# Patient Record
Sex: Female | Born: 1955 | Race: White | Hispanic: No | Smoking: Never smoker
Health system: Southern US, Community
[De-identification: ages and names within clinical notes are randomized; demographics above are authoritative.]

## PROBLEM LIST (undated history)

## (undated) DIAGNOSIS — C801 Malignant (primary) neoplasm, unspecified: Secondary | ICD-10-CM

## (undated) DIAGNOSIS — I81 Portal vein thrombosis: Secondary | ICD-10-CM

## (undated) HISTORY — DX: Portal vein thrombosis: I81

## (undated) HISTORY — PX: COLON SURGERY: SHX602

## (undated) HISTORY — PX: TONSILLECTOMY: SUR1361

---

## 2016-04-27 ENCOUNTER — Telehealth: Payer: Self-pay | Admitting: Oncology

## 2016-04-27 ENCOUNTER — Encounter: Payer: Self-pay | Admitting: Oncology

## 2016-04-27 NOTE — Telephone Encounter (Signed)
Pt returned call and confirmed appt, completed intake, mailed pt letter, and faxed referring provider appt date/time

## 2016-05-02 ENCOUNTER — Encounter: Payer: Self-pay | Admitting: Oncology

## 2016-05-02 ENCOUNTER — Telehealth: Payer: Self-pay | Admitting: Oncology

## 2016-05-02 NOTE — Telephone Encounter (Signed)
Pt returned all and confirmed appt. Pt letter mailed

## 2016-05-07 ENCOUNTER — Ambulatory Visit: Payer: Self-pay | Admitting: Oncology

## 2016-05-15 ENCOUNTER — Telehealth: Payer: Self-pay | Admitting: Oncology

## 2016-05-15 ENCOUNTER — Encounter: Payer: Self-pay | Admitting: *Deleted

## 2016-05-15 ENCOUNTER — Ambulatory Visit (HOSPITAL_BASED_OUTPATIENT_CLINIC_OR_DEPARTMENT_OTHER): Payer: BLUE CROSS/BLUE SHIELD | Admitting: Oncology

## 2016-05-15 VITALS — BP 122/62 | HR 102 | Temp 98.7°F | Resp 17 | Wt 185.6 lb

## 2016-05-15 DIAGNOSIS — C187 Malignant neoplasm of sigmoid colon: Secondary | ICD-10-CM

## 2016-05-15 DIAGNOSIS — C779 Secondary and unspecified malignant neoplasm of lymph node, unspecified: Secondary | ICD-10-CM | POA: Diagnosis not present

## 2016-05-15 DIAGNOSIS — C189 Malignant neoplasm of colon, unspecified: Secondary | ICD-10-CM | POA: Insufficient documentation

## 2016-05-15 DIAGNOSIS — Z800 Family history of malignant neoplasm of digestive organs: Secondary | ICD-10-CM | POA: Diagnosis not present

## 2016-05-15 NOTE — Telephone Encounter (Signed)
Spoke wit Tiffany @ Interventional Radiology, per Port-a-cath placement appt on 05/21/16. NPO after 7 a.m.Marland Kitchen Chemo education class scheduled on  05/22/16. Avs report and appt schd given per 05/15/16 los.

## 2016-05-15 NOTE — Progress Notes (Signed)
[facility] New Patient Consult   Referring MD: Garlan Fillers, Md [address] [place], [place] [address]   Martha Robinson 60 y.o.  28-May-1956    Reason for Referral: Colon cancer   HPI: Martha Robinson recently relocated to [facility] and establish with a primary physician. She was referred to Dr. Shana Chute for a colonoscopy on 04/20/2016. She has a history of postprandial vomiting for many years and reports being evaluated for gallbladder disease while in [place]. Dr. Shana Chute performed an upper endoscopy and colonoscopy on 04/20/2016. Esophagitis, and a hiatal hernia were identified on the upper endoscopy. Biopsies were obtained from the esophagus and stomach. Revealed multiple diverticula in the left colon. An ulcerated 5 cm mass was noted in the sigmoid colon at 20 cm from the anus. Multiple biopsies were obtained and the area was tattooed. The pathology revealed H. pylori in the stomach. The biopsy from the esophagus revealed no evidence of dysplasia or malignancy. The sigmoid mass returned as invasive moderately differentiated adenocarcinoma. She was referred for staging CTs of the abdomen and pelvis on 05/02/2016. The lung bases are clear. The liver appeared normal. Mild focal wall thickening of the sigmoid colon with an additional area of possible focal wall thickening in the rectosigmoid junction. Small upper abdominal and retroperitoneal nodes measure up to 8 mm felt to be reactive. No regional lymphadenopathy at the sigmoid mesocolon. She was referred to Dr. Christian Mate and was taken to the operating room for a laparoscopic hand assisted sigmoid colectomy on 05/04/2016. The pathology ([id number]) revealed an invasive moderately differentiated adenocarcinoma. Tumor invaded into but not through the muscularis propria. One of 14 lymph nodes contained metastatic adenocarcinoma. The tumor was located in the sigmoid colon. No macroscopic tumor perforation. Lymphovascular  and perineural invasion were not identified. Mild to moderate peritumoral lymphocytic response. Resection margins were negative. The tumor returned with no loss of mismatch repair protein expression. She reports low abdominal bloating following surgery.  Past medical history: 1. G2 P2 2. H. pylori 04/23/2016   Surgical history: 1. Tonsillectomy as a child 2. Sinus surgery 3. Bladder suspension   Medications: Reviewed  Allergies:  Allergies  Allergen Reactions  . Sulfur Itching    Family history: Her daughter was diagnosed with colon cancer at age 78. A maternal uncle had "cancer "in his 76s. She had 3 sisters and one brother. No other family history of cancer.  Social History:   She lives with her daughter in [place]. She is retired from a [employer]. She does not use cigarettes or alcohol. No transfusion history. No risk factors for HIV or hepatitis.    ROS:   Positives include: Intermittent postprandial emesis-15-20 years, decreased stool caliber for several years, bloated feeling in the low abdomen following surgery  A complete ROS was otherwise negative.  Physical Exam:  Blood pressure 122/62, pulse (!) 102, temperature 98.7 F (37.1 C), temperature source Oral, resp. rate 17, weight 185 lb 9.6 oz (84.2 kg), SpO2 98 %.  HEENT: Oropharynx without visible mass, neck without mass Lungs: Clear bilaterally Cardiac: Regular rate and rhythm Abdomen: No hepatosplenomegaly, no mass, healed surgical incisions with staples in place  Vascular: No leg edema Lymph nodes: No cervical, supraclavicular, axillary, or inguinal nodes Neurologic: Alert and oriented, the motor exam appears intact in the upper and lower extremities Skin: No rash Musculoskeletal: No spine tenderness   LAB: CEA on 05/03/2016-1.4   05/03/2016-hemolymph 15, platelets 301, white count 7.8, creatinine 0.89, bilirubin 0.3, AST 22,  ALT 24, alkaline phosphatase 141 Imaging:  As per history of  present illness   Assessment/Plan:   1. 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 H. pylori diagnosed on upper endoscopy 04/23/2016  3.   Family history of colon cancer   Disposition:   Martha Robinson has been diagnosed with early stage III colon cancer. I discussed the prognosis and adjuvant treatment options with Martha Robinson and her daughter. We reviewed the details of the surgical pathology report. She has a good prognosis compared to the average stage III patient. We discussed evidence supporting a benefit from adjuvant 5-fluorouracil based chemotherapy in this setting. We also discussed the expected benefit associated with the addition of oxaliplatin.  I recommend adjuvant chemotherapy. We discussed 6 months of single agent capecitabine versus 3 months of either FOLFOX or CAPOX. I reviewed the recent data supporting the equivalence of 3 months and 6 months of adjuvant therapy in resected early stage III colon cancer. I explained the data is more firm with the CAPOX regimen.  She agrees to treatment with 5-FU and oxaliplatin. We reviewed the details of the FOLFOX and CAPOX regimens. She is most comfortable with FOLFOX chemotherapy.  I reviewed the potential toxicities associated with the FOLFOX regimen including the chance for nausea/vomiting, mucositis, alopecia, diarrhea, and hematologic toxicity. We discussed the rash, sun sensitivity, hyperpigmentation, and hand/foot syndrome associated with 5-fluorouracil. We reviewed the various types of neuropathy and allergic reaction seen with oxaliplatin. She agrees to proceed. Martha Robinson will attend a chemotherapy teaching class.  She will be referred for placement of a Port-A-Cath. The plan is to complete cycle 1 FOLFOX on  05/30/2016. We will see her for an office visit that day. We will refer her for a staging chest CT.  She does not appear to have hereditary non-polyposis colon cancer syndrome, but her family members are at increased risk of developing colon cancer and should receive appropriate screening. We discussed diet and exercise maneuvers that may decrease the risk of developing colon cancer.  Approximately 50 minutes were spent with the patient today. The majority of the time was used for counseling and coordination of care.  Betsy Coder, MD  05/15/2016, 3:23 PM

## 2016-05-15 NOTE — Progress Notes (Signed)
Oncology Nurse Navigator Documentation  Oncology Nurse Navigator Flowsheets 05/15/2016  Navigator Location [facility]  Navigator Encounter Type Initial MedOnc  Abnormal Finding Date 04/20/2016  Confirmed Diagnosis Date 04/23/2016  Surgery Date 05/03/2016  Patient Visit Type MedOnc;Initial  Treatment Phase Pre-Tx/Tx Discussion  Barriers/Navigation Needs Education  Education Newly Diagnosed Cancer Education  Support Groups/Services GI Support Group;Other-[facility] Support Services  Acuity Level 1  Time Spent with Patient 15  Met briefly with patient and daughter after new patient visit. Explained the role of the GI Nurse Navigator and provided New Patient Packet with information on: 1. Colon  cancer 2. Support groups 3. Advanced Directives 4. Fall Safety Plan Will follow up on her schedule and meet with her at 1st treatment. Daughter is a prior patient of Dr. Gearldine Shown who went through chemo for colon cancer as well.  Merceda Elks, RN, BSN GI Oncology [place]

## 2016-05-15 NOTE — Progress Notes (Signed)
START ON PATHWAY REGIMEN - Colorectal  COS67: mFOLFOX6 q14 Days x 6 Months   A cycle is every 14 days:     Oxaliplatin (Eloxatin(R)) 85 mg/m2 in 250 mL D5W IV over 2 hours day 1, q14 days Dose Mod: None     Leucovorin 400 mg/m2 in 250 mL D5W IV over 2 hours day 1, q14 days, followed immediately by Dose Mod: None     5-Fluorouracil 400 mg/m2 IV bolus over 2-4 minutes day 1, q14 days Dose Mod: None     5-Fluorouracil 2,400 mg/m2 in _____mL NS CIV as a 46 hour infusion starting on day 1, q14 days Dose Mod: None Additional Orders: **Note: order sheet contains two q14 day cycles**  **Always confirm dose/schedule in your pharmacy ordering system**    Patient Characteristics: Colon Adjuvant, Stage III A, B, and C AJCC T Stage: 2 AJCC N Stage: 1 AJCC Stage Grouping: IIIA AJCC M Stage: 0 Current evidence of distant metastases? No  Intent of Therapy: Curative Intent, Discussed with Patient

## 2016-05-16 ENCOUNTER — Telehealth: Payer: Self-pay | Admitting: *Deleted

## 2016-05-16 NOTE — Telephone Encounter (Signed)
Per LOS I have scheduled appts and notifeid the scheduler

## 2016-05-21 ENCOUNTER — Ambulatory Visit: Payer: BLUE CROSS/BLUE SHIELD

## 2016-05-21 ENCOUNTER — Other Ambulatory Visit (HOSPITAL_BASED_OUTPATIENT_CLINIC_OR_DEPARTMENT_OTHER): Payer: BLUE CROSS/BLUE SHIELD

## 2016-05-21 DIAGNOSIS — C189 Malignant neoplasm of colon, unspecified: Secondary | ICD-10-CM

## 2016-05-21 DIAGNOSIS — C187 Malignant neoplasm of sigmoid colon: Secondary | ICD-10-CM | POA: Diagnosis not present

## 2016-05-21 LAB — CBC WITH DIFFERENTIAL/PLATELET
BASO%: 0.7 % (ref 0.0–2.0)
Basophils Absolute: 0.1 10*3/uL (ref 0.0–0.1)
EOS%: 1.3 % (ref 0.0–7.0)
Eosinophils Absolute: 0.1 10*3/uL (ref 0.0–0.5)
HCT: 35.7 % (ref 34.8–46.6)
HGB: 12 g/dL (ref 11.6–15.9)
LYMPH%: 11.5 % — AB (ref 14.0–49.7)
MCH: 30 pg (ref 25.1–34.0)
MCHC: 33.5 g/dL (ref 31.5–36.0)
MCV: 89.4 fL (ref 79.5–101.0)
MONO#: 0.5 10*3/uL (ref 0.1–0.9)
MONO%: 6.9 % (ref 0.0–14.0)
NEUT#: 6 10*3/uL (ref 1.5–6.5)
NEUT%: 79.6 % — AB (ref 38.4–76.8)
PLATELETS: 242 10*3/uL (ref 145–400)
RBC: 3.99 10*6/uL (ref 3.70–5.45)
RDW: 13.3 % (ref 11.2–14.5)
WBC: 7.5 10*3/uL (ref 3.9–10.3)
lymph#: 0.9 10*3/uL (ref 0.9–3.3)

## 2016-05-21 LAB — COMPREHENSIVE METABOLIC PANEL
ALK PHOS: 162 U/L — AB (ref 40–150)
ALT: 33 U/L (ref 0–55)
ANION GAP: 11 meq/L (ref 3–11)
AST: 20 U/L (ref 5–34)
Albumin: 2.9 g/dL — ABNORMAL LOW (ref 3.5–5.0)
BILIRUBIN TOTAL: 0.57 mg/dL (ref 0.20–1.20)
BUN: 8.4 mg/dL (ref 7.0–26.0)
CHLORIDE: 105 meq/L (ref 98–109)
CO2: 27 meq/L (ref 22–29)
CREATININE: 0.9 mg/dL (ref 0.6–1.1)
Calcium: 9.4 mg/dL (ref 8.4–10.4)
EGFR: 68 mL/min/{1.73_m2} — ABNORMAL LOW (ref >90)
Glucose: 148 mg/dl — ABNORMAL HIGH (ref 70–140)
POTASSIUM: 3.6 meq/L (ref 3.5–5.1)
Sodium: 142 mEq/L (ref 136–145)
Total Protein: 7.6 g/dL (ref 6.4–8.3)

## 2016-05-21 LAB — CEA (IN HOUSE-CHCC): CEA (CHCC-IN HOUSE): 1.39 ng/mL (ref 0.00–5.00)

## 2016-05-21 MED ORDER — LIDOCAINE-PRILOCAINE 2.5-2.5 % EX CREA: 1.0000 "application " | TOPICAL_CREAM | CUTANEOUS | 3 refills | Status: DC | PRN

## 2016-05-22 ENCOUNTER — Other Ambulatory Visit: Payer: Self-pay | Admitting: Oncology

## 2016-05-22 ENCOUNTER — Other Ambulatory Visit: Payer: Self-pay | Admitting: *Deleted

## 2016-05-22 ENCOUNTER — Other Ambulatory Visit (HOSPITAL_COMMUNITY): Payer: BLUE CROSS/BLUE SHIELD

## 2016-05-22 ENCOUNTER — Ambulatory Visit (HOSPITAL_COMMUNITY): Payer: BLUE CROSS/BLUE SHIELD

## 2016-05-22 ENCOUNTER — Telehealth: Payer: Self-pay | Admitting: *Deleted

## 2016-05-22 DIAGNOSIS — C187 Malignant neoplasm of sigmoid colon: Secondary | ICD-10-CM

## 2016-05-22 NOTE — Telephone Encounter (Signed)
Left message on voicemail for pt to call office.  IR had port placement scheduled but it was canceled with a reference to surgeon placing port. Need to verify whether she wants port placed here by radiology.

## 2016-05-22 NOTE — Telephone Encounter (Signed)
Spoke with Tiffany in radiology: daughter canceled port placement and confirmed that pt is scheduled for port placement with her surgeon on 9/28. Will make Dr. Benay Spice and GI navigator aware.

## 2016-05-23 ENCOUNTER — Encounter: Payer: Self-pay | Admitting: *Deleted

## 2016-05-23 ENCOUNTER — Other Ambulatory Visit: Payer: Self-pay | Admitting: Oncology

## 2016-05-24 ENCOUNTER — Telehealth: Payer: Self-pay | Admitting: *Deleted

## 2016-05-24 NOTE — Telephone Encounter (Signed)
Called to follow up on how her port placement went today and was told by daughter she went to ER on Tuesday night still with severe abdominal pain and CT scan found a portal vein thrombus. Was put on heparin drip and converted to Xarelto today. Now has UTI. Surgeon has postponed the port at this time. Being discharged tomorrow.  Dr. Benay Spice was made aware and ordered to cancel chemo for 10/4, but to keep the office visit with him that day at 0945. Daughter verbalizes understanding.

## 2016-05-27 ENCOUNTER — Other Ambulatory Visit: Payer: Self-pay | Admitting: Oncology

## 2016-05-30 ENCOUNTER — Ambulatory Visit: Payer: BLUE CROSS/BLUE SHIELD

## 2016-05-30 ENCOUNTER — Encounter: Payer: Self-pay | Admitting: Nurse Practitioner

## 2016-05-30 ENCOUNTER — Telehealth: Payer: Self-pay | Admitting: Oncology

## 2016-05-30 ENCOUNTER — Ambulatory Visit (HOSPITAL_BASED_OUTPATIENT_CLINIC_OR_DEPARTMENT_OTHER): Payer: BLUE CROSS/BLUE SHIELD | Admitting: Nurse Practitioner

## 2016-05-30 ENCOUNTER — Other Ambulatory Visit: Payer: Self-pay | Admitting: *Deleted

## 2016-05-30 ENCOUNTER — Encounter: Payer: Self-pay | Admitting: *Deleted

## 2016-05-30 VITALS — BP 132/74 | HR 94 | Temp 97.7°F | Resp 16 | Ht 66.0 in | Wt 186.0 lb

## 2016-05-30 DIAGNOSIS — C187 Malignant neoplasm of sigmoid colon: Secondary | ICD-10-CM | POA: Diagnosis not present

## 2016-05-30 DIAGNOSIS — R109 Unspecified abdominal pain: Secondary | ICD-10-CM

## 2016-05-30 DIAGNOSIS — C189 Malignant neoplasm of colon, unspecified: Secondary | ICD-10-CM

## 2016-05-30 DIAGNOSIS — I81 Portal vein thrombosis: Secondary | ICD-10-CM | POA: Diagnosis not present

## 2016-05-30 HISTORY — DX: Portal vein thrombosis: I81

## 2016-05-30 MED ORDER — ONDANSETRON HCL 8 MG PO TABS: 8.0000 mg | ORAL_TABLET | Freq: Three times a day (TID) | ORAL | 1 refills | Status: DC | PRN

## 2016-05-30 MED ORDER — PROCHLORPERAZINE MALEATE 10 MG PO TABS: 10.0000 mg | ORAL_TABLET | Freq: Four times a day (QID) | ORAL | 1 refills | Status: DC | PRN

## 2016-05-30 NOTE — Progress Notes (Addendum)
  [place] OFFICE PROGRESS NOTE   Diagnosis:  Colon cancer  INTERVAL HISTORY:   Martha Robinson returns as scheduled. She was recently found to have a portal vein thrombosis after presenting with severe abdominal pain. She is currently on Xarelto. She was also diagnosed with a urinary tract infection and started on antibiotics.  She reports slight improvement in the abdominal pain. She denies any bleeding. She is nauseated this morning. She attributes the nausea to taking an antibiotic on an empty stomach.  She reports both her mother and father had "blood clots" in their legs. Her mother is on warfarin. She has 4 siblings. None have had blood clots to her knowledge.  Objective:  Vital signs in last 24 hours:  Blood pressure 132/74, pulse 94, temperature 97.7 F (36.5 C), temperature source Oral, resp. rate 16, height '5\' 6"'$  (1.676 m), weight 186 lb (84.4 kg), SpO2 100 %.    HEENT: No thrush or ulcers. Resp: Lungs clear bilaterally. Cardio: Regular rate and rhythm. GI: Abdomen is soft. No hepatomegaly. Tender over the low abdomen. Vascular: No leg edema. Skin: She has a few small scabbed skin lesions at the right lower leg and left upper leg.   Lab Results:  Lab Results  Component Value Date   WBC 7.5 05/21/2016   HGB 12.0 05/21/2016   HCT 35.7 05/21/2016   MCV 89.4 05/21/2016   PLT 242 05/21/2016   NEUTROABS 6.0 05/21/2016    Imaging:  No results found.  Medications: I have reviewed the patient's current medications.  Assessment/Plan: 1. 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 1/14 lymph nodes positive for metastatic carcinoma ? No loss of mismatch repair protein expression  2. H. pylori diagnosed on upper endoscopy 04/23/2016 3. Family history of colon cancer 4. Portal vein thrombosis 05/23/2016; on Xarelto 5. Family history of  "blood clots"   Disposition: Martha Robinson appears stable. She was recently found to have a portal vein thrombosis after presenting with abdominal pain. She was started on anticoagulation, now on Xarelto. She reports a family history of blood clots in both parents. We will obtain a hypercoagulation panel when she returns in 2 weeks.  We decided to delay the start of chemotherapy until 06/13/2016. Rather than having a Port-A-Cath placed she will have a PICC line placed the morning of 06/13/2016.  She will be seen in follow-up prior to proceeding with treatment on 06/13/2016. She will contact the office in the interim with any problems.  Patient seen with Dr. Benay Spice. 25 minutes were spent face-to-face at today's visit with the majority of that time involved in counseling/coordination of care.    Ned Card ANP/GNP-BC   05/30/2016  10:17 AM  This was a shared visit with Ned Card. Ms. Miers was diagnosed with portal vein thrombosis last week. She is now maintained on Xarelto. She continues to have abdominal pain. We decided to delay the first cycle of chemotherapy until 06/13/2016. She will not be able to come off of anticoagulation for Port-A-Cath placement.  Julieanne [place], M.D.

## 2016-05-30 NOTE — Progress Notes (Signed)
Oncology Nurse Navigator Documentation  Oncology Nurse Navigator Flowsheets 05/30/2016  Navigator Location [facility]  Navigator Encounter Type Follow-up Appt  Abnormal Finding Date -  Confirmed Diagnosis Date -  Surgery Date -  Patient Visit Type MedOnc  Treatment Phase Pre-Tx/Tx Discussion  Barriers/Navigation Needs Coordination of International aid/development worker for Upcoming Surgery/ Treatment;Pain/ Symptom Management--discussed that portal vein thrombus can cause pain for a long period of time and she needs to take her pain medication at least 1-2 times day, especially at night because she needs to get good sleep to function. Discussed PICC line and home care for this and weekly dressing change at [facility]--showed her a photograph of PICC line  Interventions Coordination of Care;Education Method  Education Method Verbal;Demonstration;Teach-back  Support Groups/Services -  Acuity Level 2  Time Spent with Patient 30  PICC line scheduled for 06/13/16 with arrival at radiology at [facility] at 0800. She verbalized understanding and agrees.

## 2016-05-30 NOTE — Telephone Encounter (Signed)
NO 10/4 LOS

## 2016-06-07 ENCOUNTER — Encounter: Payer: Self-pay | Admitting: *Deleted

## 2016-06-07 NOTE — Progress Notes (Signed)
  Oncology Nurse Navigator Documentation  Navigator Location: [facility] (06/07/16 1000) Navigator Encounter Type: Letter/Fax/Email (06/07/16 1000)   Request sent to [facility] [phone]) --2nd request--for MSI testing on case #[id number] from 05/04/16).

## 2016-06-10 ENCOUNTER — Other Ambulatory Visit: Payer: Self-pay | Admitting: Oncology

## 2016-06-13 ENCOUNTER — Ambulatory Visit (HOSPITAL_BASED_OUTPATIENT_CLINIC_OR_DEPARTMENT_OTHER): Payer: BLUE CROSS/BLUE SHIELD

## 2016-06-13 ENCOUNTER — Telehealth: Payer: Self-pay | Admitting: Oncology

## 2016-06-13 ENCOUNTER — Ambulatory Visit: Payer: BLUE CROSS/BLUE SHIELD

## 2016-06-13 ENCOUNTER — Encounter: Payer: Self-pay | Admitting: *Deleted

## 2016-06-13 ENCOUNTER — Ambulatory Visit (HOSPITAL_BASED_OUTPATIENT_CLINIC_OR_DEPARTMENT_OTHER): Payer: BLUE CROSS/BLUE SHIELD | Admitting: Oncology

## 2016-06-13 ENCOUNTER — Encounter (HOSPITAL_COMMUNITY): Payer: Self-pay | Admitting: General Surgery

## 2016-06-13 ENCOUNTER — Ambulatory Visit (HOSPITAL_COMMUNITY)
Admission: RE | Admit: 2016-06-13 | Discharge: 2016-06-13 | Disposition: A | Discharge status: Home / Self Care | Payer: BLUE CROSS/BLUE SHIELD | Source: Ambulatory Visit | Attending: Nurse Practitioner | Admitting: Nurse Practitioner

## 2016-06-13 ENCOUNTER — Other Ambulatory Visit: Payer: Self-pay | Admitting: Nurse Practitioner

## 2016-06-13 ENCOUNTER — Other Ambulatory Visit (HOSPITAL_BASED_OUTPATIENT_CLINIC_OR_DEPARTMENT_OTHER): Payer: BLUE CROSS/BLUE SHIELD

## 2016-06-13 VITALS — BP 117/57 | HR 84 | Temp 97.3°F | Resp 18 | Ht 66.0 in | Wt 185.5 lb

## 2016-06-13 VITALS — BP 134/74 | HR 89 | Temp 98.4°F | Resp 17

## 2016-06-13 DIAGNOSIS — Z79899 Other long term (current) drug therapy: Secondary | ICD-10-CM | POA: Diagnosis not present

## 2016-06-13 DIAGNOSIS — Z95828 Presence of other vascular implants and grafts: Secondary | ICD-10-CM

## 2016-06-13 DIAGNOSIS — I81 Portal vein thrombosis: Secondary | ICD-10-CM

## 2016-06-13 DIAGNOSIS — C187 Malignant neoplasm of sigmoid colon: Secondary | ICD-10-CM

## 2016-06-13 DIAGNOSIS — I878 Other specified disorders of veins: Secondary | ICD-10-CM | POA: Insufficient documentation

## 2016-06-13 DIAGNOSIS — C182 Malignant neoplasm of ascending colon: Secondary | ICD-10-CM

## 2016-06-13 DIAGNOSIS — Z452 Encounter for adjustment and management of vascular access device: Secondary | ICD-10-CM

## 2016-06-13 DIAGNOSIS — Z5111 Encounter for antineoplastic chemotherapy: Secondary | ICD-10-CM | POA: Diagnosis not present

## 2016-06-13 HISTORY — PX: IR GENERIC HISTORICAL: IMG1180011

## 2016-06-13 LAB — CBC WITH DIFFERENTIAL/PLATELET
BASO%: 0.3 % (ref 0.0–2.0)
Basophils Absolute: 0 10*3/uL (ref 0.0–0.1)
EOS%: 2.1 % (ref 0.0–7.0)
Eosinophils Absolute: 0.1 10*3/uL (ref 0.0–0.5)
HCT: 33.9 % — ABNORMAL LOW (ref 34.8–46.6)
HEMOGLOBIN: 11.1 g/dL — AB (ref 11.6–15.9)
LYMPH#: 0.9 10*3/uL (ref 0.9–3.3)
LYMPH%: 22.7 % (ref 14.0–49.7)
MCH: 29.1 pg (ref 25.1–34.0)
MCHC: 32.7 g/dL (ref 31.5–36.0)
MCV: 88.7 fL (ref 79.5–101.0)
MONO#: 0.3 10*3/uL (ref 0.1–0.9)
MONO%: 7.1 % (ref 0.0–14.0)
NEUT%: 67.8 % (ref 38.4–76.8)
NEUTROS ABS: 2.6 10*3/uL (ref 1.5–6.5)
NRBC: 0 % (ref 0–0)
Platelets: 145 10*3/uL (ref 145–400)
RBC: 3.82 10*6/uL (ref 3.70–5.45)
RDW: 14 % (ref 11.2–14.5)
WBC: 3.8 10*3/uL — AB (ref 3.9–10.3)

## 2016-06-13 LAB — COMPREHENSIVE METABOLIC PANEL
ALBUMIN: 3.3 g/dL — AB (ref 3.5–5.0)
ALK PHOS: 128 U/L (ref 40–150)
ALT: 15 U/L (ref 0–55)
AST: 20 U/L (ref 5–34)
Anion Gap: 8 mEq/L (ref 3–11)
BILIRUBIN TOTAL: 0.67 mg/dL (ref 0.20–1.20)
BUN: 9.1 mg/dL (ref 7.0–26.0)
CO2: 23 meq/L (ref 22–29)
Calcium: 9.3 mg/dL (ref 8.4–10.4)
Chloride: 109 mEq/L (ref 98–109)
Creatinine: 0.9 mg/dL (ref 0.6–1.1)
EGFR: 73 mL/min/{1.73_m2} — AB (ref >90)
GLUCOSE: 165 mg/dL — AB (ref 70–140)
Potassium: 3.7 mEq/L (ref 3.5–5.1)
SODIUM: 140 meq/L (ref 136–145)
TOTAL PROTEIN: 7.4 g/dL (ref 6.4–8.3)

## 2016-06-13 MED ORDER — SODIUM CHLORIDE 0.9% FLUSH
10.0000 mL | INTRAVENOUS | Status: DC | PRN
  Filled 2016-06-13: qty 10

## 2016-06-13 MED ORDER — HEPARIN SOD (PORK) LOCK FLUSH 100 UNIT/ML IV SOLN
500.0000 [IU] | Freq: Once | INTRAVENOUS | Status: DC | PRN
  Filled 2016-06-13: qty 5

## 2016-06-13 MED ORDER — PALONOSETRON HCL INJECTION 0.25 MG/5ML
INTRAVENOUS | Status: AC
  Filled 2016-06-13: qty 5

## 2016-06-13 MED ORDER — LIDOCAINE HCL 1 % IJ SOLN
INTRAMUSCULAR | Status: DC | PRN
  Administered 2016-06-13: 5 mL

## 2016-06-13 MED ORDER — HEPARIN SOD (PORK) LOCK FLUSH 100 UNIT/ML IV SOLN
250.0000 [IU] | Freq: Once | INTRAVENOUS | Status: AC
  Administered 2016-06-13: 250 [IU] via INTRAVENOUS
  Filled 2016-06-13: qty 5

## 2016-06-13 MED ORDER — SODIUM CHLORIDE 0.9% FLUSH
10.0000 mL | INTRAVENOUS | Status: DC | PRN
  Administered 2016-06-13: 10 mL via INTRAVENOUS
  Filled 2016-06-13: qty 10

## 2016-06-13 MED ORDER — PALONOSETRON HCL INJECTION 0.25 MG/5ML
0.2500 mg | Freq: Once | INTRAVENOUS | Status: AC
  Administered 2016-06-13: 0.25 mg via INTRAVENOUS

## 2016-06-13 MED ORDER — OXALIPLATIN CHEMO INJECTION 100 MG/20ML
85.0000 mg/m2 | Freq: Once | INTRAVENOUS | Status: AC
  Administered 2016-06-13: 170 mg via INTRAVENOUS
  Filled 2016-06-13: qty 34

## 2016-06-13 MED ORDER — DEXTROSE 5 % IV SOLN
Freq: Once | INTRAVENOUS | Status: AC
  Administered 2016-06-13: 13:00:00 via INTRAVENOUS

## 2016-06-13 MED ORDER — SODIUM CHLORIDE 0.9 % IV SOLN
2400.0000 mg/m2 | INTRAVENOUS | Status: DC
  Administered 2016-06-13: 4750 mg via INTRAVENOUS
  Filled 2016-06-13: qty 95

## 2016-06-13 MED ORDER — LIDOCAINE HCL 1 % IJ SOLN
INTRAMUSCULAR | Status: AC
Start: 2016-06-13 — End: 2016-06-13
  Filled 2016-06-13: qty 20

## 2016-06-13 MED ORDER — DEXAMETHASONE SODIUM PHOSPHATE 10 MG/ML IJ SOLN
10.0000 mg | Freq: Once | INTRAMUSCULAR | Status: AC
  Administered 2016-06-13: 10 mg via INTRAVENOUS

## 2016-06-13 MED ORDER — FLUOROURACIL CHEMO INJECTION 2.5 GM/50ML
400.0000 mg/m2 | Freq: Once | INTRAVENOUS | Status: AC
  Administered 2016-06-13: 800 mg via INTRAVENOUS
  Filled 2016-06-13: qty 16

## 2016-06-13 MED ORDER — DEXAMETHASONE SODIUM PHOSPHATE 10 MG/ML IJ SOLN
INTRAMUSCULAR | Status: AC
  Filled 2016-06-13: qty 1

## 2016-06-13 MED ORDER — LEUCOVORIN CALCIUM INJECTION 350 MG
400.0000 mg/m2 | Freq: Once | INTRAVENOUS | Status: AC
  Administered 2016-06-13: 792 mg via INTRAVENOUS
  Filled 2016-06-13: qty 39.6

## 2016-06-13 NOTE — Progress Notes (Signed)
[place] OFFICE PROGRESS NOTE   Diagnosis: Colon cancer  INTERVAL HISTORY:   Martha Robinson returns as scheduled. The abdominal pain has resolved. She underwent placement of a PICC earlier today. She is here to begin adjuvant chemotherapy.  Objective:  Vital signs in last 24 hours:  Blood pressure (!) 117/57, pulse 84, temperature 97.3 F (36.3 C), temperature source Oral, resp. rate 18, height '5\' 6"'$  (1.676 m), weight 185 lb 8 oz (84.1 kg), SpO2 99 %.    Resp: Lungs clear bilaterally Cardio: Regular rate and rhythm GI: No hepatomegaly, nontender, no mass, soft Vascular: No leg edema  Portacath/PICC-without erythema  Lab Results:  Lab Results  Component Value Date   WBC 3.8 (L) 06/13/2016   HGB 11.1 (L) 06/13/2016   HCT 33.9 (L) 06/13/2016   MCV 88.7 06/13/2016   PLT 145 06/13/2016   NEUTROABS 2.6 06/13/2016      Imaging:  Ir Fluoro Guide Cv Line Right  Result Date: 06/13/2016 INDICATION: Patient with a history of sigmoid colon cancer who needs venous access for chemotherapy. EXAM: ULTRASOUND AND FLUOROSCOPIC GUIDED PICC LINE INSERTION MEDICATIONS: 1% lidocaine. CONTRAST:  None FLUOROSCOPY TIME:  Forty a seconds (8 mGy) COMPLICATIONS: None immediate. TECHNIQUE: The procedure, risks, benefits, and alternatives were explained to the patient and informed written consent was obtained. A timeout was performed prior to the initiation of the procedure. The right upper extremity was prepped with chlorhexidine in a sterile fashion, and a sterile drape was applied covering the operative field. Maximum barrier sterile technique with sterile gowns and gloves were used for the procedure. A timeout was performed prior to the initiation of the procedure. Local anesthesia was provided with 1% lidocaine. Under direct ultrasound guidance, the right basilic vein was accessed with a micropuncture kit after the overlying soft tissues were anesthetized with 1% lidocaine. An  ultrasound image was saved for documentation purposes. A guidewire was advanced to the level of the superior caval-atrial junction for measurement purposes and the PICC line was cut to length. A peel-away sheath was placed and a 38 cm, 5 [place], single lumen was inserted to level of the superior caval-atrial junction. A post procedure spot fluoroscopic was obtained. The catheter easily aspirated and flushed and was sutured in place. A dressing was placed. The patient tolerated the procedure well without immediate post procedural complication. FINDINGS: After catheter placement, the tip lies within the superior cavoatrial junction. The catheter aspirates and flushes normally and is ready for immediate use. IMPRESSION: Successful ultrasound and fluoroscopic guided placement of a right basilic vein approach, 38 cm, 5 French, single lumen PICC with tip at the superior caval-atrial junction. The PICC line is ready for immediate use. Read by: Saverio Danker, PA-C Electronically Signed   By: Aletta Edouard M.D.   On: 06/13/2016 09:42   Ir US Guide Vasc Access Right  Result Date: 06/13/2016 INDICATION: Patient with a history of sigmoid colon cancer who needs venous access for chemotherapy. EXAM: ULTRASOUND AND FLUOROSCOPIC GUIDED PICC LINE INSERTION MEDICATIONS: 1% lidocaine. CONTRAST:  None FLUOROSCOPY TIME:  Forty a seconds (8 mGy) COMPLICATIONS: None immediate. TECHNIQUE: The procedure, risks, benefits, and alternatives were explained to the patient and informed written consent was obtained. A timeout was performed prior to the initiation of the procedure. The right upper extremity was prepped with chlorhexidine in a sterile fashion, and a sterile drape was applied covering the operative field. Maximum barrier sterile technique with sterile gowns and gloves were used for the procedure. A  timeout was performed prior to the initiation of the procedure. Local anesthesia was provided with 1% lidocaine. Under direct  ultrasound guidance, the right basilic vein was accessed with a micropuncture kit after the overlying soft tissues were anesthetized with 1% lidocaine. An ultrasound image was saved for documentation purposes. A guidewire was advanced to the level of the superior caval-atrial junction for measurement purposes and the PICC line was cut to length. A peel-away sheath was placed and a 38 cm, 5 [place], single lumen was inserted to level of the superior caval-atrial junction. A post procedure spot fluoroscopic was obtained. The catheter easily aspirated and flushed and was sutured in place. A dressing was placed. The patient tolerated the procedure well without immediate post procedural complication. FINDINGS: After catheter placement, the tip lies within the superior cavoatrial junction. The catheter aspirates and flushes normally and is ready for immediate use. IMPRESSION: Successful ultrasound and fluoroscopic guided placement of a right basilic vein approach, 38 cm, 5 French, single lumen PICC with tip at the superior caval-atrial junction. The PICC line is ready for immediate use. Read by: Saverio Danker, PA-C Electronically Signed   By: Aletta Edouard M.D.   On: 06/13/2016 09:42    Medications: I have reviewed the patient's current medications.  Assessment/Plan: 1. 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 1/14 lymph nodes positive for metastatic carcinoma ? No loss of mismatch repair protein expression ? Cycle 1 adjuvant FOLFOX 06/13/2016  2. H. pylori diagnosed on upper endoscopy 04/23/2016 3. Family history of colon cancer 4. Portal vein thrombosis 05/23/2016; on Xarelto 5. Family history of "blood clots"-hypercoagulable panel submitted 06/13/2016 6. PICC placement 06/13/2016    Disposition:  Martha Robinson appears well. The plan is to proceed with cycle 1 FOLFOX today. She will  receive an influenza vaccine today. She will return for an office visit and chemotherapy in 2 weeks.  Betsy Coder, MD  06/13/2016  11:04 AM

## 2016-06-13 NOTE — Patient Instructions (Signed)
[facility] Discharge Instructions for Patients Receiving Chemotherapy  Today you received the following chemotherapy agents Oxaliplatin, Leucovorin and Adrucil  To help prevent nausea and vomiting after your treatment, we encourage you to take your nausea medication as prescribed by MD.   If you develop nausea and vomiting that is not controlled by your nausea medication, call the clinic.   BELOW ARE SYMPTOMS THAT SHOULD BE REPORTED IMMEDIATELY:  *FEVER GREATER THAN 100.5 F  *CHILLS WITH OR WITHOUT FEVER  NAUSEA AND VOMITING THAT IS NOT CONTROLLED WITH YOUR NAUSEA MEDICATION  *UNUSUAL SHORTNESS OF BREATH  *UNUSUAL BRUISING OR BLEEDING  TENDERNESS IN MOUTH AND THROAT WITH OR WITHOUT PRESENCE OF ULCERS  *URINARY PROBLEMS  *BOWEL PROBLEMS  UNUSUAL RASH Items with * indicate a potential emergency and should be followed up as soon as possible.  Feel free to call the clinic you have any questions or concerns. The clinic phone number is [phone].  Please show the [place] at check-in to the Emergency Department and triage nurse.   Oxaliplatin Injection What is this medicine? OXALIPLATIN (ox AL i PLA tin) is a chemotherapy drug. It targets fast dividing cells, like cancer cells, and causes these cells to die. This medicine is used to treat cancers of the colon and rectum, and many other cancers. This medicine may be used for other purposes; ask your health care provider or pharmacist if you have questions. What should I tell my health care provider before I take this medicine? They need to know if you have any of these conditions: -kidney disease -an unusual or allergic reaction to oxaliplatin, other chemotherapy, other medicines, foods, dyes, or preservatives -pregnant or trying to get pregnant -breast-feeding How should I use this medicine? This drug is given as an infusion into a vein. It is administered in a hospital or clinic by a specially  trained health care professional. Talk to your pediatrician regarding the use of this medicine in children. Special care may be needed. Overdosage: If you think you have taken too much of this medicine contact a poison control center or emergency room at once. NOTE: This medicine is only for you. Do not share this medicine with others. What if I miss a dose? It is important not to miss a dose. Call your doctor or health care professional if you are unable to keep an appointment. What may interact with this medicine? -medicines to increase blood counts like filgrastim, pegfilgrastim, sargramostim -probenecid -some antibiotics like amikacin, gentamicin, neomycin, polymyxin B, streptomycin, tobramycin -zalcitabine Talk to your doctor or health care professional before taking any of these medicines: -acetaminophen -aspirin -ibuprofen -ketoprofen -naproxen This list may not describe all possible interactions. Give your health care provider a list of all the medicines, herbs, non-prescription drugs, or dietary supplements you use. Also tell them if you smoke, drink alcohol, or use illegal drugs. Some items may interact with your medicine. What should I watch for while using this medicine? Your condition will be monitored carefully while you are receiving this medicine. You will need important blood work done while you are taking this medicine. This medicine can make you more sensitive to cold. Do not drink cold drinks or use ice. Cover exposed skin before coming in contact with cold temperatures or cold objects. When out in cold weather wear warm clothing and cover your mouth and nose to warm the air that goes into your lungs. Tell your doctor if you get sensitive to the cold. This drug  may make you feel generally unwell. This is not uncommon, as chemotherapy can affect healthy cells as well as cancer cells. Report any side effects. Continue your course of treatment even though you feel ill unless  your doctor tells you to stop. In some cases, you may be given additional medicines to help with side effects. Follow all directions for their use. Call your doctor or health care professional for advice if you get a fever, chills or sore throat, or other symptoms of a cold or flu. Do not treat yourself. This drug decreases your body's ability to fight infections. Try to avoid being around people who are sick. This medicine may increase your risk to bruise or bleed. Call your doctor or health care professional if you notice any unusual bleeding. Be careful brushing and flossing your teeth or using a toothpick because you may get an infection or bleed more easily. If you have any dental work done, tell your dentist you are receiving this medicine. Avoid taking products that contain aspirin, acetaminophen, ibuprofen, naproxen, or ketoprofen unless instructed by your doctor. These medicines may hide a fever. Do not become pregnant while taking this medicine. Women should inform their doctor if they wish to become pregnant or think they might be pregnant. There is a potential for serious side effects to an unborn child. Talk to your health care professional or pharmacist for more information. Do not breast-feed an infant while taking this medicine. Call your doctor or health care professional if you get diarrhea. Do not treat yourself. What side effects may I notice from receiving this medicine? Side effects that you should report to your doctor or health care professional as soon as possible: -allergic reactions like skin rash, itching or hives, swelling of the face, lips, or tongue -low blood counts - This drug may decrease the number of white blood cells, red blood cells and platelets. You may be at increased risk for infections and bleeding. -signs of infection - fever or chills, cough, sore throat, pain or difficulty passing urine -signs of decreased platelets or bleeding - bruising, pinpoint red spots  on the skin, black, tarry stools, nosebleeds -signs of decreased red blood cells - unusually weak or tired, fainting spells, lightheadedness -breathing problems -chest pain, pressure -cough -diarrhea -jaw tightness -mouth sores -nausea and vomiting -pain, swelling, redness or irritation at the injection site -pain, tingling, numbness in the hands or feet -problems with balance, talking, walking -redness, blistering, peeling or loosening of the skin, including inside the mouth -trouble passing urine or change in the amount of urine Side effects that usually do not require medical attention (report to your doctor or health care professional if they continue or are bothersome): -changes in vision -constipation -hair loss -loss of appetite -metallic taste in the mouth or changes in taste -stomach pain This list may not describe all possible side effects. Call your doctor for medical advice about side effects. You may report side effects to FDA at 1-800-FDA-1088. Where should I keep my medicine? This drug is given in a hospital or clinic and will not be stored at home. NOTE: This sheet is a summary. It may not cover all possible information. If you have questions about this medicine, talk to your doctor, pharmacist, or health care provider.    2016, Elsevier/Gold Standard. (2008-03-09 17:22:47)  Leucovorin injection What is this medicine? LEUCOVORIN (loo koe VOR in) is used to prevent or treat the harmful effects of some medicines. This medicine is used to treat  anemia caused by a low amount of folic acid in the body. It is also used with 5-fluorouracil (5-FU) to treat colon cancer. This medicine may be used for other purposes; ask your health care provider or pharmacist if you have questions. What should I tell my health care provider before I take this medicine? They need to know if you have any of these conditions: -anemia from low levels of vitamin B-12 in the blood -an unusual or  allergic reaction to leucovorin, folic acid, other medicines, foods, dyes, or preservatives -pregnant or trying to get pregnant -breast-feeding How should I use this medicine? This medicine is for injection into a muscle or into a vein. It is given by a health care professional in a hospital or clinic setting. Talk to your pediatrician regarding the use of this medicine in children. Special care may be needed. Overdosage: If you think you have taken too much of this medicine contact a poison control center or emergency room at once. NOTE: This medicine is only for you. Do not share this medicine with others. What if I miss a dose? This does not apply. What may interact with this medicine? -capecitabine -fluorouracil -phenobarbital -phenytoin -primidone -trimethoprim-sulfamethoxazole This list may not describe all possible interactions. Give your health care provider a list of all the medicines, herbs, non-prescription drugs, or dietary supplements you use. Also tell them if you smoke, drink alcohol, or use illegal drugs. Some items may interact with your medicine. What should I watch for while using this medicine? Your condition will be monitored carefully while you are receiving this medicine. This medicine may increase the side effects of 5-fluorouracil, 5-FU. Tell your doctor or health care professional if you have diarrhea or mouth sores that do not get better or that get worse. What side effects may I notice from receiving this medicine? Side effects that you should report to your doctor or health care professional as soon as possible: -allergic reactions like skin rash, itching or hives, swelling of the face, lips, or tongue -breathing problems -fever, infection -mouth sores -unusual bleeding or bruising -unusually weak or tired Side effects that usually do not require medical attention (report to your doctor or health care professional if they continue or are  bothersome): -constipation or diarrhea -loss of appetite -nausea, vomiting This list may not describe all possible side effects. Call your doctor for medical advice about side effects. You may report side effects to FDA at 1-800-FDA-1088. Where should I keep my medicine? This drug is given in a hospital or clinic and will not be stored at home. NOTE: This sheet is a summary. It may not cover all possible information. If you have questions about this medicine, talk to your doctor, pharmacist, or health care provider.    2016, Elsevier/Gold Standard. (2008-02-17 16:50:29)   Fluorouracil, 5-FU injection What is this medicine? FLUOROURACIL, 5-FU (flure oh YOOR a sil) is a chemotherapy drug. It slows the growth of cancer cells. This medicine is used to treat many types of cancer like breast cancer, colon or rectal cancer, pancreatic cancer, and stomach cancer. This medicine may be used for other purposes; ask your health care provider or pharmacist if you have questions. What should I tell my health care provider before I take this medicine? They need to know if you have any of these conditions: -blood disorders -dihydropyrimidine dehydrogenase (DPD) deficiency -infection (especially a virus infection such as chickenpox, cold sores, or herpes) -kidney disease -liver disease -malnourished, poor nutrition -recent or ongoing  radiation therapy -an unusual or allergic reaction to fluorouracil, other chemotherapy, other medicines, foods, dyes, or preservatives -pregnant or trying to get pregnant -breast-feeding How should I use this medicine? This drug is given as an infusion or injection into a vein. It is administered in a hospital or clinic by a specially trained health care professional. Talk to your pediatrician regarding the use of this medicine in children. Special care may be needed. Overdosage: If you think you have taken too much of this medicine contact a poison control center or  emergency room at once. NOTE: This medicine is only for you. Do not share this medicine with others. What if I miss a dose? It is important not to miss your dose. Call your doctor or health care professional if you are unable to keep an appointment. What may interact with this medicine? -allopurinol -cimetidine -dapsone -digoxin -hydroxyurea -leucovorin -levamisole -medicines for seizures like ethotoin, fosphenytoin, phenytoin -medicines to increase blood counts like filgrastim, pegfilgrastim, sargramostim -medicines that treat or prevent blood clots like warfarin, enoxaparin, and dalteparin -methotrexate -metronidazole -pyrimethamine -some other chemotherapy drugs like busulfan, cisplatin, estramustine, vinblastine -trimethoprim -trimetrexate -vaccines Talk to your doctor or health care professional before taking any of these medicines: -acetaminophen -aspirin -ibuprofen -ketoprofen -naproxen This list may not describe all possible interactions. Give your health care provider a list of all the medicines, herbs, non-prescription drugs, or dietary supplements you use. Also tell them if you smoke, drink alcohol, or use illegal drugs. Some items may interact with your medicine. What should I watch for while using this medicine? Visit your doctor for checks on your progress. This drug may make you feel generally unwell. This is not uncommon, as chemotherapy can affect healthy cells as well as cancer cells. Report any side effects. Continue your course of treatment even though you feel ill unless your doctor tells you to stop. In some cases, you may be given additional medicines to help with side effects. Follow all directions for their use. Call your doctor or health care professional for advice if you get a fever, chills or sore throat, or other symptoms of a cold or flu. Do not treat yourself. This drug decreases your body's ability to fight infections. Try to avoid being around people  who are sick. This medicine may increase your risk to bruise or bleed. Call your doctor or health care professional if you notice any unusual bleeding. Be careful brushing and flossing your teeth or using a toothpick because you may get an infection or bleed more easily. If you have any dental work done, tell your dentist you are receiving this medicine. Avoid taking products that contain aspirin, acetaminophen, ibuprofen, naproxen, or ketoprofen unless instructed by your doctor. These medicines may hide a fever. Do not become pregnant while taking this medicine. Women should inform their doctor if they wish to become pregnant or think they might be pregnant. There is a potential for serious side effects to an unborn child. Talk to your health care professional or pharmacist for more information. Do not breast-feed an infant while taking this medicine. Men should inform their doctor if they wish to father a child. This medicine may lower sperm counts. Do not treat diarrhea with over the counter products. Contact your doctor if you have diarrhea that lasts more than 2 days or if it is severe and watery. This medicine can make you more sensitive to the sun. Keep out of the sun. If you cannot avoid being in the sun, wear  protective clothing and use sunscreen. Do not use sun lamps or tanning beds/booths. What side effects may I notice from receiving this medicine? Side effects that you should report to your doctor or health care professional as soon as possible: -allergic reactions like skin rash, itching or hives, swelling of the face, lips, or tongue -low blood counts - this medicine may decrease the number of white blood cells, red blood cells and platelets. You may be at increased risk for infections and bleeding. -signs of infection - fever or chills, cough, sore throat, pain or difficulty passing urine -signs of decreased platelets or bleeding - bruising, pinpoint red spots on the skin, black, tarry  stools, blood in the urine -signs of decreased red blood cells - unusually weak or tired, fainting spells, lightheadedness -breathing problems -changes in vision -chest pain -mouth sores -nausea and vomiting -pain, swelling, redness at site where injected -pain, tingling, numbness in the hands or feet -redness, swelling, or sores on hands or feet -stomach pain -unusual bleeding Side effects that usually do not require medical attention (report to your doctor or health care professional if they continue or are bothersome): -changes in finger or toe nails -diarrhea -dry or itchy skin -hair loss -headache -loss of appetite -sensitivity of eyes to the light -stomach upset -unusually teary eyes This list may not describe all possible side effects. Call your doctor for medical advice about side effects. You may report side effects to FDA at 1-800-FDA-1088. Where should I keep my medicine? This drug is given in a hospital or clinic and will not be stored at home. NOTE: This sheet is a summary. It may not cover all possible information. If you have questions about this medicine, talk to your doctor, pharmacist, or health care provider.    2016, Elsevier/Gold Standard. (2007-12-17 13:53:16)

## 2016-06-13 NOTE — Procedures (Signed)
Successful placement of a single lumen 38cm PICC line at the [facility] Ready for immediate use  Tyric Rodeheaver E 9:40 AM 06/13/2016

## 2016-06-13 NOTE — Telephone Encounter (Signed)
GAVE RELATIVE AVS REPORT AND APPOINTMENTS FOR October AND November

## 2016-06-13 NOTE — Progress Notes (Signed)
Oncology Nurse Navigator Documentation  Oncology Nurse Navigator Flowsheets 06/13/2016  Navigator Location [facility]  Referral date to RadOnc/MedOnc -  Navigator Encounter Type Treatment  Abnormal Finding Date -  Confirmed Diagnosis Date -  Surgery Date 05/04/2016  Treatment Initiated Date 06/13/2016  Patient Visit Type MedOnc  Treatment Phase First Chemo Tx #1 FOLFOX  Barriers/Navigation Needs Coordination of Care;Education  Education Other--procedure to flush PICC line on MWF (when not at [facility])  Interventions Education;Coordination of Care--genetics referral  Education Method Demonstration;Verbal--demonstrated to daughter, Carrolyn Leigh who will ask to be allowed to flush at [facility] on Friday when here for pump d/c and observed by nurse (patient has supplies)  Support Groups/Services -  Acuity -  Time Spent with Patient 30  Faxed request to [facility] Pathology Department for MSI testing on her case #[id number] att: Zigmund Daniel( had faxed request to [facility] in error last week).

## 2016-06-14 ENCOUNTER — Other Ambulatory Visit: Payer: Self-pay | Admitting: *Deleted

## 2016-06-14 MED ORDER — RIVAROXABAN 20 MG PO TABS: 20.0000 mg | ORAL_TABLET | Freq: Every day | ORAL | 2 refills | Status: DC

## 2016-06-15 ENCOUNTER — Telehealth: Payer: Self-pay

## 2016-06-15 ENCOUNTER — Telehealth: Payer: Self-pay | Admitting: *Deleted

## 2016-06-15 ENCOUNTER — Ambulatory Visit (HOSPITAL_BASED_OUTPATIENT_CLINIC_OR_DEPARTMENT_OTHER): Payer: BLUE CROSS/BLUE SHIELD

## 2016-06-15 VITALS — BP 117/57 | HR 85 | Temp 98.6°F | Resp 18

## 2016-06-15 DIAGNOSIS — C187 Malignant neoplasm of sigmoid colon: Secondary | ICD-10-CM | POA: Diagnosis not present

## 2016-06-15 DIAGNOSIS — C182 Malignant neoplasm of ascending colon: Secondary | ICD-10-CM

## 2016-06-15 LAB — CARDIOLIPIN ANTIBODIES, IGG, IGM, IGA
ANTICARDIOLIPIN IGM: 11 [MPL'U]/mL (ref 0–12)
Anticardiolipin Ab,IgA,Qn: <9 APL U/mL (ref 0–11)
Anticardiolipin Ab,IgG,Qn: <9 GPL U/mL (ref 0–14)

## 2016-06-15 LAB — LUPUS ANTICOAGULANT PANEL
DRVVT MIX: 126.5 s — AB (ref 0.0–47.0)
HEXAGONAL PHASE PHOSPHOLIPID: 3 s (ref 0–11)
PTT-LA INCUB MIX: 56.3 s — AB (ref 0.0–48.9)
PTT-LA MIX: 41.5 s (ref 0.0–48.9)
PTT-LA: 52.1 s — ABNORMAL HIGH (ref 0.0–51.9)
dRVVT Confirm: 1.8 ratio — ABNORMAL HIGH (ref 0.8–1.2)
dRVVT: 165.3 s — ABNORMAL HIGH (ref 0.0–47.0)

## 2016-06-15 LAB — PROTEIN C ACTIVITY: PROTEIN C ACTIVITY: 135 % (ref 73–180)

## 2016-06-15 LAB — PROTEIN S ACTIVITY: PROTEIN S ACTIVITY: 71 % (ref 63–140)

## 2016-06-15 LAB — BETA-2-GLYCOPROTEIN I ABS, IGG/M/A
Beta-2 Glyco 1 IgM: <9 GPI IgM units (ref 0–32)
Beta-2 Glycoprotein I Ab, IgG: <9 GPI IgG units (ref 0–20)

## 2016-06-15 LAB — ANTITHROMBIN III: Antithrombin Activity: 199 % — ABNORMAL HIGH (ref 75–135)

## 2016-06-15 LAB — PROTEIN C, TOTAL: Protein C Antigen: 89 % (ref 60–150)

## 2016-06-15 LAB — PROTEIN S, TOTAL: Protein S, Total: 140 % (ref 60–150)

## 2016-06-15 MED ORDER — SODIUM CHLORIDE 0.9% FLUSH
10.0000 mL | INTRAVENOUS | Status: DC | PRN
  Administered 2016-06-15: 10 mL
  Filled 2016-06-15: qty 10

## 2016-06-15 MED ORDER — HEPARIN SOD (PORK) LOCK FLUSH 100 UNIT/ML IV SOLN
500.0000 [IU] | Freq: Once | INTRAVENOUS | Status: AC | PRN
  Administered 2016-06-15: 250 [IU]
  Filled 2016-06-15: qty 5

## 2016-06-15 NOTE — Telephone Encounter (Signed)
Called and left message for pt informing her FMLA paperwork was finished and ready to be picked up at front desk.

## 2016-06-15 NOTE — Telephone Encounter (Signed)
Call received from patient's daughter after hours regarding her mother's medication.  Call placed back to [facility] and orders reviewed regarding Xarelto.  Patient's daughter, Morey Hummingbird appreciative of call back.

## 2016-06-16 ENCOUNTER — Encounter (HOSPITAL_COMMUNITY): Payer: Self-pay | Admitting: Emergency Medicine

## 2016-06-16 ENCOUNTER — Emergency Department (HOSPITAL_COMMUNITY)
Admission: EM | Admit: 2016-06-16 | Discharge: 2016-06-17 | Disposition: A | Discharge status: Home / Self Care | Payer: BLUE CROSS/BLUE SHIELD | Attending: Emergency Medicine | Admitting: Emergency Medicine

## 2016-06-16 DIAGNOSIS — Y69 Unspecified misadventure during surgical and medical care: Secondary | ICD-10-CM | POA: Diagnosis not present

## 2016-06-16 DIAGNOSIS — Z452 Encounter for adjustment and management of vascular access device: Secondary | ICD-10-CM | POA: Diagnosis present

## 2016-06-16 DIAGNOSIS — Z7901 Long term (current) use of anticoagulants: Secondary | ICD-10-CM | POA: Insufficient documentation

## 2016-06-16 DIAGNOSIS — Z85038 Personal history of other malignant neoplasm of large intestine: Secondary | ICD-10-CM | POA: Diagnosis not present

## 2016-06-16 DIAGNOSIS — T80211A Bloodstream infection due to central venous catheter, initial encounter: Secondary | ICD-10-CM | POA: Insufficient documentation

## 2016-06-16 DIAGNOSIS — R109 Unspecified abdominal pain: Secondary | ICD-10-CM

## 2016-06-16 DIAGNOSIS — R1030 Lower abdominal pain, unspecified: Secondary | ICD-10-CM | POA: Diagnosis not present

## 2016-06-16 DIAGNOSIS — T82838A Hemorrhage of vascular prosthetic devices, implants and grafts, initial encounter: Secondary | ICD-10-CM

## 2016-06-16 HISTORY — DX: Malignant (primary) neoplasm, unspecified: C80.1

## 2016-06-16 MED ORDER — ONDANSETRON HCL 4 MG/2ML IJ SOLN
4.0000 mg | Freq: Once | INTRAMUSCULAR | Status: AC
  Administered 2016-06-17: 4 mg via INTRAVENOUS
  Filled 2016-06-16: qty 2

## 2016-06-16 MED ORDER — FENTANYL CITRATE (PF) 100 MCG/2ML IJ SOLN
50.0000 ug | Freq: Once | INTRAMUSCULAR | Status: AC
  Administered 2016-06-17: 50 ug via INTRAVENOUS
  Filled 2016-06-16: qty 2

## 2016-06-16 NOTE — ED Provider Notes (Signed)
By signing my name below, I, Gwenlyn Fudge, attest that this documentation has been prepared under the direction and in the presence of McIntire, DO. Electronically Signed: Gwenlyn Fudge, ED Scribe. 06/16/16. 11:55 PM.   TIME SEEN: 11:46 PM   CHIEF COMPLAINT: Bleeding around PICC line  HPI:  Martha Robinson is a 60 y.o. female with PMHx of Colon cancer who presents to the Emergency Department complaining of gradual onset bleeding around her right upper extremity PICC line onset earlier today. She states she noticed blood in the dressing, but denies puss or pain.  Reports associated nausea s/p being taken off chemotherapy on Friday.  Was receiving chemotherapy through this PICC line. PICC line was placed October 18. She is on Xarelto. She has a history of a portal vein thrombus.  She also reports associated non-emergent gradual onset, intermittent lower crampy abdominal pain onset earlier today.  States this is a new pain for her. Pt states she has not been experiencing good bowel movement recently and her last bowel movement was at 8 PM tonight. She states she is able to have flatulence. Denies fever, chills, vomiting, diarrhea, dysuria, hematuria, vaginal bleeding, vaginal discharge.  Patient recently diagnosed with adenocarcinoma. Had a sigmoid colectomy with Dr. Christian Mate at [facility] on September 8.  ROS: See HPI Constitutional: no fever  Eyes: no drainage  ENT: no runny nose   Cardiovascular:  no chest pain  Resp: no SOB  GI: no vomiting GU: no dysuria Integumentary: no rash  Allergy: no hives  Musculoskeletal: no leg swelling  Neurological: no slurred speech ROS otherwise negative  PAST MEDICAL HISTORY/PAST SURGICAL HISTORY:  Past Medical History:  Diagnosis Date  . Cancer ([place])    colon  . 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 Authorizing Provider   HYDROcodone-acetaminophen (NORCO/VICODIN) 5-325 MG tablet TK 1 T PO Q 4 H PRN P FOR UP TO 7 DAYS 05/19/16   Historical Provider, MD  lidocaine-prilocaine (EMLA) cream Apply 1 application topically as needed. 05/21/16   Ladell Pier, MD  ondansetron (ZOFRAN) 8 MG tablet Take 1 tablet (8 mg total) by mouth every 8 (eight) hours as needed for nausea or vomiting. 05/30/16   Ladell Pier, MD  pantoprazole (PROTONIX) 40 MG tablet Take 40 mg by mouth daily.    Historical Provider, MD  prochlorperazine (COMPAZINE) 10 MG tablet Take 1 tablet (10 mg total) by mouth every 6 (six) hours as needed for nausea or vomiting. 05/30/16   Ladell Pier, MD  rivaroxaban (XARELTO) 20 MG TABS tablet Take 1 tablet (20 mg total) by mouth daily with supper. 06/14/16   Ladell Pier, MD    ALLERGIES:  Allergies  Allergen Reactions  . Sulfur Itching    SOCIAL HISTORY:  Social History  Substance Use Topics  . Smoking status: Never Smoker  . Smokeless tobacco: Never Used  . Alcohol use No    FAMILY HISTORY: History reviewed. No pertinent family history.  EXAM: BP 111/60 (BP Location: Left Arm)   Pulse 87   Temp 98.7 F (37.1 C) (Oral)   Resp 16   Ht 5\' 6"  (1.676 m)   Wt 185 lb (83.9 kg)   SpO2 97%   BMI 29.86 kg/m  CONSTITUTIONAL: Alert and oriented and responds appropriately to questions. Well-appearing; well-nourished HEAD: Normocephalic EYES: Conjunctivae clear, PERRL ENT: normal nose; no rhinorrhea; moist mucous membranes NECK:  Supple, no meningismus, no LAD  CARD: RRR; S1 and S2 appreciated; no murmurs, no clicks, no rubs, no gallops RESP: Normal chest excursion without splinting or tachypnea; breath sounds clear and equal bilaterally; no wheezes, no rhonchi, no rales, no hypoxia or respiratory distress, speaking full sentences ABD/GI: Hyperactive bowel sounds; non-distended; soft, diffusely tender throughout the abdomen, no rebound, no guarding, no peritoneal signs BACK:  The back appears  normal and is non-tender to palpation, there is no CVA tenderness EXT: Normal ROM in all joints; non-tender to palpation; no edema; normal capillary refill; no cyanosis, no calf tenderness or swelling , PICC line in right upper extremity with no active bleeding or drainage, no ecchymosis, erythema, warmth or tenderness SKIN: Normal color for age and race; warm; no rash NEURO: Moves all extremities equally, sensation to light touch intact diffusely, cranial nerves II through XII intact PSYCH: The patient's mood and manner are appropriate. Grooming and personal hygiene are appropriate.  MEDICAL DECISION MAKING: Patient here with complaints of bleeding from her PICC line. It is no longer bleeding and there is no sign of infection. It has been assessed by the IV nurse and is working properly. Dressing has been changed.   Patient also complaining of new abdominal pain and nausea. Diffusely tender throughout the abdomen, especially in the lower abdomen, but non-peritoneal. No distention. The patient reports she is passing gas, but decreased vomitus. She does have hyperactive bowel sounds. Labs show mild leukopenia, but no neutropenia. Lipase mildly elevated. LFTs normal. We'll obtain urinalysis, CT of her abdomen and pelvis for further evaluation. We'll give IV fentanyl, Zofran for pain and nausea.  ED PROGRESS: CT scan today shows stable thrombus. Imaging completely unchanged compared to prior CT scan on 05/23/2016 per radiology. The patient reports feeling better. No further bleeding from her PICC line site. I feel she is safe to be discharged with close outpatient follow-up. Patient and family verbalize understanding and are comfortable with this plan.  She has prescription for Vicodin and Zofran at home. We'll also discharge with prescription for Phenergan.  At this time, I do not feel there is any life-threatening condition present. I have reviewed and discussed all results (EKG, imaging, lab, urine as  appropriate), exam findings with patient/family. I have reviewed nursing notes and appropriate previous records.  I feel the patient is safe to be discharged home without further emergent workup and can continue workup as an outpatient as needed. Discussed usual and customary return precautions. Patient/family verbalize understanding and are comfortable with this plan.  Outpatient follow-up has been provided. All questions have been answered.      I personally performed the services described in this documentation, which was scribed in my presence. The recorded information has been reviewed and is accurate.    [place], DO 06/17/16 [id number]

## 2016-06-16 NOTE — ED Triage Notes (Signed)
Pt reports bleeding around PICC line in right bicep that was first noticed today. Pt reports having chemo disconnected on 06/15/16. Pt now reporting abd pain that began today as well. Antinausea medication taken at 1900

## 2016-06-16 NOTE — ED Notes (Signed)
Dressing change performed by IV Team

## 2016-06-17 ENCOUNTER — Emergency Department (HOSPITAL_COMMUNITY): Payer: BLUE CROSS/BLUE SHIELD

## 2016-06-17 LAB — COMPREHENSIVE METABOLIC PANEL
ALT: 20 U/L (ref 14–54)
ANION GAP: 6 (ref 5–15)
AST: 22 U/L (ref 15–41)
Albumin: 3.3 g/dL — ABNORMAL LOW (ref 3.5–5.0)
Alkaline Phosphatase: 92 U/L (ref 38–126)
BUN: 16 mg/dL (ref 6–20)
CALCIUM: 8.5 mg/dL — AB (ref 8.9–10.3)
CHLORIDE: 104 mmol/L (ref 101–111)
CO2: 26 mmol/L (ref 22–32)
Creatinine, Ser: 0.73 mg/dL (ref 0.44–1.00)
GFR calc non Af Amer: >60 mL/min (ref >60)
GLUCOSE: 148 mg/dL — AB (ref 65–99)
POTASSIUM: 3.6 mmol/L (ref 3.5–5.1)
SODIUM: 136 mmol/L (ref 135–145)
TOTAL PROTEIN: 6.7 g/dL (ref 6.5–8.1)
Total Bilirubin: 1.1 mg/dL (ref 0.3–1.2)

## 2016-06-17 LAB — CBC WITH DIFFERENTIAL/PLATELET
Basophils Absolute: 0 10*3/uL (ref 0.0–0.1)
Basophils Relative: 0 %
EOS ABS: 0.1 10*3/uL (ref 0.0–0.7)
EOS PCT: 2 %
HCT: 30.1 % — ABNORMAL LOW (ref 36.0–46.0)
Hemoglobin: 10 g/dL — ABNORMAL LOW (ref 12.0–15.0)
LYMPHS ABS: 1.2 10*3/uL (ref 0.7–4.0)
Lymphocytes Relative: 30 %
MCH: 29 pg (ref 26.0–34.0)
MCHC: 33.2 g/dL (ref 30.0–36.0)
MCV: 87.2 fL (ref 78.0–100.0)
Monocytes Absolute: 0.1 10*3/uL (ref 0.1–1.0)
Monocytes Relative: 2 %
Neutro Abs: 2.6 10*3/uL (ref 1.7–7.7)
Neutrophils Relative %: 66 %
PLATELETS: 99 10*3/uL — AB (ref 150–400)
RBC: 3.45 MIL/uL — AB (ref 3.87–5.11)
RDW: 14 % (ref 11.5–15.5)
WBC: 3.9 10*3/uL — AB (ref 4.0–10.5)

## 2016-06-17 LAB — URINALYSIS, ROUTINE W REFLEX MICROSCOPIC
BILIRUBIN URINE: NEGATIVE
GLUCOSE, UA: NEGATIVE mg/dL
HGB URINE DIPSTICK: NEGATIVE
KETONES UR: NEGATIVE mg/dL
Leukocytes, UA: NEGATIVE
Nitrite: NEGATIVE
PH: 7 (ref 5.0–8.0)
Protein, ur: NEGATIVE mg/dL
Specific Gravity, Urine: 1.022 (ref 1.005–1.030)

## 2016-06-17 LAB — LIPASE, BLOOD: LIPASE: 64 U/L — AB (ref 11–51)

## 2016-06-17 MED ORDER — PROMETHAZINE HCL 25 MG PO TABS: 25.0000 mg | ORAL_TABLET | Freq: Four times a day (QID) | ORAL | 0 refills | Status: DC | PRN

## 2016-06-17 MED ORDER — IOPAMIDOL (ISOVUE-300) INJECTION 61%
100.0000 mL | Freq: Once | INTRAVENOUS | Status: AC | PRN
  Administered 2016-06-17: 100 mL via INTRAVENOUS

## 2016-06-17 NOTE — ED Notes (Signed)
Pt in CT.

## 2016-06-19 LAB — PROTHROMBIN GENE MUTATION

## 2016-06-20 ENCOUNTER — Telehealth: Payer: Self-pay | Admitting: *Deleted

## 2016-06-20 ENCOUNTER — Encounter: Payer: BLUE CROSS/BLUE SHIELD | Admitting: Nurse Practitioner

## 2016-06-20 ENCOUNTER — Ambulatory Visit (HOSPITAL_BASED_OUTPATIENT_CLINIC_OR_DEPARTMENT_OTHER): Payer: BLUE CROSS/BLUE SHIELD

## 2016-06-20 ENCOUNTER — Other Ambulatory Visit: Payer: Self-pay | Admitting: *Deleted

## 2016-06-20 ENCOUNTER — Other Ambulatory Visit: Payer: BLUE CROSS/BLUE SHIELD

## 2016-06-20 DIAGNOSIS — C187 Malignant neoplasm of sigmoid colon: Secondary | ICD-10-CM

## 2016-06-20 LAB — FACTOR 5 LEIDEN

## 2016-06-20 MED ORDER — TRAMADOL HCL 50 MG PO TABS: 50.0000 mg | ORAL_TABLET | Freq: Four times a day (QID) | ORAL | 0 refills | Status: DC | PRN

## 2016-06-20 MED ORDER — HEPARIN SOD (PORK) LOCK FLUSH 100 UNIT/ML IV SOLN
250.0000 [IU] | Freq: Once | INTRAVENOUS | Status: AC
  Administered 2016-06-20: 250 [IU] via INTRAVENOUS
  Filled 2016-06-20: qty 5

## 2016-06-20 MED ORDER — SODIUM CHLORIDE 0.9 % IJ SOLN
3.0000 mL | Freq: Once | INTRAMUSCULAR | Status: AC
  Administered 2016-06-20: 3 mL via INTRAVENOUS
  Filled 2016-06-20: qty 10

## 2016-06-20 MED ORDER — SODIUM CHLORIDE 0.9 % IJ SOLN
3.0000 mL | Freq: Once | INTRAMUSCULAR | Status: DC
  Filled 2016-06-20: qty 10

## 2016-06-20 NOTE — Telephone Encounter (Signed)
Pt filled out a walk-in form reporting constipation abdominal pain and "feeling blah". Spoke with pt and daughter in lobby. She reports Hydrocodone is effective for abdominal pain but she is concerned this is contributing to constipation. Last BM 10/24 after enema. Pt reports stool softener has not been helping. Pt has been using Miralax PRN. Requests to try something milder for pain. Reviewed with Drue Second, NP: Order received for Tramadol 50 mg Q6 hours PRN. Prescription called to pharmacy.  Instructed pt to take Senokot-S twice daily. Use Miralax daily. Call office for persistent pain, N/V or inability to tolerate POs. She and daughter voiced understanding. Pt will call office with an update, per NP- will work in 10/26 if above is not effective.

## 2016-06-24 ENCOUNTER — Other Ambulatory Visit: Payer: Self-pay | Admitting: Oncology

## 2016-06-27 ENCOUNTER — Telehealth: Payer: Self-pay | Admitting: Oncology

## 2016-06-27 ENCOUNTER — Ambulatory Visit (HOSPITAL_BASED_OUTPATIENT_CLINIC_OR_DEPARTMENT_OTHER): Payer: BLUE CROSS/BLUE SHIELD

## 2016-06-27 ENCOUNTER — Other Ambulatory Visit (HOSPITAL_BASED_OUTPATIENT_CLINIC_OR_DEPARTMENT_OTHER): Payer: BLUE CROSS/BLUE SHIELD

## 2016-06-27 ENCOUNTER — Ambulatory Visit (HOSPITAL_BASED_OUTPATIENT_CLINIC_OR_DEPARTMENT_OTHER): Payer: BLUE CROSS/BLUE SHIELD | Admitting: Oncology

## 2016-06-27 ENCOUNTER — Ambulatory Visit: Payer: BLUE CROSS/BLUE SHIELD

## 2016-06-27 VITALS — BP 107/42 | HR 88 | Temp 98.5°F | Resp 17 | Ht 66.0 in | Wt 183.3 lb

## 2016-06-27 DIAGNOSIS — C187 Malignant neoplasm of sigmoid colon: Secondary | ICD-10-CM

## 2016-06-27 DIAGNOSIS — I81 Portal vein thrombosis: Secondary | ICD-10-CM

## 2016-06-27 DIAGNOSIS — Z7901 Long term (current) use of anticoagulants: Secondary | ICD-10-CM | POA: Diagnosis not present

## 2016-06-27 DIAGNOSIS — D701 Agranulocytosis secondary to cancer chemotherapy: Secondary | ICD-10-CM | POA: Diagnosis not present

## 2016-06-27 DIAGNOSIS — Z95828 Presence of other vascular implants and grafts: Secondary | ICD-10-CM

## 2016-06-27 LAB — COMPREHENSIVE METABOLIC PANEL
ALT: 21 U/L (ref 0–55)
AST: 21 U/L (ref 5–34)
Albumin: 3 g/dL — ABNORMAL LOW (ref 3.5–5.0)
Alkaline Phosphatase: 136 U/L (ref 40–150)
Anion Gap: 8 mEq/L (ref 3–11)
BUN: 9.3 mg/dL (ref 7.0–26.0)
CALCIUM: 8.8 mg/dL (ref 8.4–10.4)
CHLORIDE: 108 meq/L (ref 98–109)
CO2: 25 meq/L (ref 22–29)
Creatinine: 0.8 mg/dL (ref 0.6–1.1)
EGFR: 83 mL/min/{1.73_m2} — ABNORMAL LOW (ref >90)
Glucose: 141 mg/dl — ABNORMAL HIGH (ref 70–140)
POTASSIUM: 3.5 meq/L (ref 3.5–5.1)
SODIUM: 141 meq/L (ref 136–145)
Total Bilirubin: 0.47 mg/dL (ref 0.20–1.20)
Total Protein: 6.7 g/dL (ref 6.4–8.3)

## 2016-06-27 LAB — CBC WITH DIFFERENTIAL/PLATELET
BASO%: 1.5 % (ref 0.0–2.0)
Basophils Absolute: 0 10*3/uL (ref 0.0–0.1)
EOS%: 8 % — AB (ref 0.0–7.0)
Eosinophils Absolute: 0.2 10*3/uL (ref 0.0–0.5)
HEMATOCRIT: 29.6 % — AB (ref 34.8–46.6)
HGB: 9.7 g/dL — ABNORMAL LOW (ref 11.6–15.9)
LYMPH%: 45.8 % (ref 14.0–49.7)
MCH: 28.8 pg (ref 25.1–34.0)
MCHC: 32.8 g/dL (ref 31.5–36.0)
MCV: 87.8 fL (ref 79.5–101.0)
MONO#: 0.3 10*3/uL (ref 0.1–0.9)
MONO%: 16.4 % — AB (ref 0.0–14.0)
NEUT#: 0.6 10*3/uL — ABNORMAL LOW (ref 1.5–6.5)
NEUT%: 28.3 % — AB (ref 38.4–76.8)
Platelets: 129 10*3/uL — ABNORMAL LOW (ref 145–400)
RBC: 3.37 10*6/uL — AB (ref 3.70–5.45)
RDW: 14.7 % — ABNORMAL HIGH (ref 11.2–14.5)
WBC: 2 10*3/uL — ABNORMAL LOW (ref 3.9–10.3)
lymph#: 0.9 10*3/uL (ref 0.9–3.3)
nRBC: 0 % (ref 0–0)

## 2016-06-27 MED ORDER — SODIUM CHLORIDE 0.9% FLUSH
10.0000 mL | INTRAVENOUS | Status: DC | PRN
  Administered 2016-06-27: 10 mL via INTRAVENOUS
  Filled 2016-06-27: qty 10

## 2016-06-27 NOTE — Progress Notes (Signed)
  [facility] OFFICE PROGRESS NOTE   Diagnosis: Colon cancer  INTERVAL HISTORY:   Ms. Music returns as scheduled. She completed cycle 1 FOLFOX beginning 06/13/2016. She reports no neuropathy symptoms, allergic reaction, or diarrhea. She had constipation and developed abdominal pain. The pain was relieved with tramadol. She has no pain at present. She was seen in the emergency room on 06/16/2016 with abdominal pain and bleeding at the PICC site. A CT of the abdomen was compared to a CT from 05/23/2016. Thrombus of the central superior Mr. gain and main portal vein was again noted. No inflamed bowel. Constipation was noted. Senokot helped the constipation.  Objective:  Vital signs in last 24 hours:  Blood pressure (!) 107/42, pulse 88, temperature 98.5 F (36.9 C), temperature source Oral, resp. rate 17, height _0  (1.676 m), weight 183 lb 4.8 oz (83.1 kg), SpO2 100 %.    HEENT: No thrush or ulcers Resp: Lungs clear bilaterally Cardio: Regular rate and rhythm GI: Soft and nontender, no hepatosplenomegaly Vascular: No leg edema  Skin: Dry skin at the leg bilaterally  Portacath/PICC-without erythema, small ecchymosis surrounding the PICC  Lab Results:  Lab Results  Component Value Date   WBC 2.0 (L) 06/27/2016   HGB 9.7 (L) 06/27/2016   HCT 29.6 (L) 06/27/2016   MCV 87.8 06/27/2016   PLT 129 (L) 06/27/2016   NEUTROABS 0.6 (L) 06/27/2016     Medications: I have reviewed the patient's current medications.  Assessment/Plan: 1. 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 1/14 lymph nodes positive for metastatic carcinoma ? No loss of mismatch repair protein expression ? Cycle 1 adjuvant FOLFOX 06/13/2016  2. H. pylori diagnosed on upper endoscopy 04/23/2016 3. 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 Family history of "blood clots"-hypercoagulable panel submitted 06/13/2016 6. PICC placement 06/13/2016 7. Neutropenia secondary to chemotherapy  Disposition:  Martha Robinson has completed one cycle of FOLFOX. She tolerated chemotherapy well. I suspect the abdominal pain she had following chemotherapy was related to constipation. The pain has resolved. She continues Xarelto for treatment of portal/mesenteric vein thrombosis.  She has developed neutropenia following cycle 1 FOLFOX. Chemotherapy will be held today. She will contact us for a fever or symptoms of an infection. She declines an influenza vaccine. She will be scheduled for the next cycle of FOLFOX on 07/04/2016. She will receive Neulasta support. I reviewed the potential toxicities associated with Neulasta and she agrees to proceed.  [name] will be scheduled for FOLFOX and an office visit 07/16/2016.  Betsy Coder, MD  06/27/2016  1:23 PM

## 2016-06-27 NOTE — Telephone Encounter (Signed)
Patient scheduled to have chemo on 11/8  a day that is capped to capacity. Spoke with Sharyn Lull to see if the patient could possibly still have appointment that day was told 11/7, 11/8, and 11/9 are capped to capacity with no room to schedule anyone else in. Spoke with nurse Lavella Lemons about the situation. Per the nurse, will see what needs to be changed with MD and then let me know how to move forward.

## 2016-07-02 ENCOUNTER — Telehealth: Payer: Self-pay | Admitting: Medical Oncology

## 2016-07-02 ENCOUNTER — Telehealth: Payer: Self-pay | Admitting: Oncology

## 2016-07-02 NOTE — Telephone Encounter (Signed)
Has not heard back about chemo appt this week for her mom. She understands it was to capacity, but she is rechecking for anything open. VM sent to [facility].

## 2016-07-02 NOTE — Telephone Encounter (Signed)
LEFT MESSAGE FOR PATIENT RE APPOINTMENT FOR 11/7. PATIENT TO GET NEW SCHEDULE 11/7.   INFUSION AT CAPACITY 11/8. PER INFUSION MGR OK TO ADD FOR 11/7.

## 2016-07-03 ENCOUNTER — Other Ambulatory Visit: Payer: Self-pay | Admitting: *Deleted

## 2016-07-03 ENCOUNTER — Other Ambulatory Visit (HOSPITAL_BASED_OUTPATIENT_CLINIC_OR_DEPARTMENT_OTHER): Payer: BLUE CROSS/BLUE SHIELD

## 2016-07-03 ENCOUNTER — Ambulatory Visit (HOSPITAL_BASED_OUTPATIENT_CLINIC_OR_DEPARTMENT_OTHER): Payer: BLUE CROSS/BLUE SHIELD

## 2016-07-03 VITALS — BP 120/54 | HR 83 | Temp 98.1°F | Resp 18

## 2016-07-03 DIAGNOSIS — C187 Malignant neoplasm of sigmoid colon: Secondary | ICD-10-CM

## 2016-07-03 DIAGNOSIS — C182 Malignant neoplasm of ascending colon: Secondary | ICD-10-CM

## 2016-07-03 DIAGNOSIS — Z5111 Encounter for antineoplastic chemotherapy: Secondary | ICD-10-CM | POA: Diagnosis not present

## 2016-07-03 LAB — CBC WITH DIFFERENTIAL/PLATELET
BASO%: 0.8 % (ref 0.0–2.0)
BASOS ABS: 0 10*3/uL (ref 0.0–0.1)
EOS ABS: 0.2 10*3/uL (ref 0.0–0.5)
EOS%: 4.8 % (ref 0.0–7.0)
HEMATOCRIT: 33.6 % — AB (ref 34.8–46.6)
HEMOGLOBIN: 11.1 g/dL — AB (ref 11.6–15.9)
LYMPH#: 1 10*3/uL (ref 0.9–3.3)
LYMPH%: 26.7 % (ref 14.0–49.7)
MCH: 29.2 pg (ref 25.1–34.0)
MCHC: 33 g/dL (ref 31.5–36.0)
MCV: 88.4 fL (ref 79.5–101.0)
MONO#: 0.4 10*3/uL (ref 0.1–0.9)
MONO%: 10.8 % (ref 0.0–14.0)
NEUT#: 2.2 10*3/uL (ref 1.5–6.5)
NEUT%: 56.9 % (ref 38.4–76.8)
PLATELETS: 124 10*3/uL — AB (ref 145–400)
RBC: 3.8 10*6/uL (ref 3.70–5.45)
RDW: 15.3 % — AB (ref 11.2–14.5)
WBC: 3.8 10*3/uL — ABNORMAL LOW (ref 3.9–10.3)

## 2016-07-03 MED ORDER — FLUCONAZOLE 100 MG PO TABS: 100.0000 mg | ORAL_TABLET | Freq: Every day | ORAL | 0 refills | Status: DC

## 2016-07-03 MED ORDER — FLUOROURACIL CHEMO INJECTION 2.5 GM/50ML
400.0000 mg/m2 | Freq: Once | INTRAVENOUS | Status: AC
  Administered 2016-07-03: 800 mg via INTRAVENOUS
  Filled 2016-07-03: qty 16

## 2016-07-03 MED ORDER — SODIUM CHLORIDE 0.9 % IV SOLN
2400.0000 mg/m2 | INTRAVENOUS | Status: DC
  Administered 2016-07-03: 4750 mg via INTRAVENOUS
  Filled 2016-07-03: qty 95

## 2016-07-03 MED ORDER — PALONOSETRON HCL INJECTION 0.25 MG/5ML
INTRAVENOUS | Status: AC
  Filled 2016-07-03: qty 5

## 2016-07-03 MED ORDER — DEXAMETHASONE SODIUM PHOSPHATE 10 MG/ML IJ SOLN
INTRAMUSCULAR | Status: AC
  Filled 2016-07-03: qty 1

## 2016-07-03 MED ORDER — DEXAMETHASONE SODIUM PHOSPHATE 10 MG/ML IJ SOLN
10.0000 mg | Freq: Once | INTRAMUSCULAR | Status: AC
  Administered 2016-07-03: 10 mg via INTRAVENOUS

## 2016-07-03 MED ORDER — LEUCOVORIN CALCIUM INJECTION 350 MG
400.0000 mg/m2 | Freq: Once | INTRAVENOUS | Status: AC
  Administered 2016-07-03: 792 mg via INTRAVENOUS
  Filled 2016-07-03: qty 39.6

## 2016-07-03 MED ORDER — PALONOSETRON HCL INJECTION 0.25 MG/5ML
0.2500 mg | Freq: Once | INTRAVENOUS | Status: AC
  Administered 2016-07-03: 0.25 mg via INTRAVENOUS

## 2016-07-03 MED ORDER — OXALIPLATIN CHEMO INJECTION 100 MG/20ML
85.0000 mg/m2 | Freq: Once | INTRAVENOUS | Status: AC
  Administered 2016-07-03: 170 mg via INTRAVENOUS
  Filled 2016-07-03: qty 34

## 2016-07-03 MED ORDER — DEXTROSE 5 % IV SOLN
Freq: Once | INTRAVENOUS | Status: AC
  Administered 2016-07-03: 10:00:00 via INTRAVENOUS

## 2016-07-03 NOTE — Patient Instructions (Addendum)
[facility] Discharge Instructions for Patients Receiving Chemotherapy  Today you received the following chemotherapy agents Oxaliplatin, Leucovorin and Adrucil  To help prevent nausea and vomiting after your treatment, we encourage you to take your nausea medication as prescribed by MD.   If you develop nausea and vomiting that is not controlled by your nausea medication, call the clinic.   BELOW ARE SYMPTOMS THAT SHOULD BE REPORTED IMMEDIATELY:  *FEVER GREATER THAN 100.5 F  *CHILLS WITH OR WITHOUT FEVER  NAUSEA AND VOMITING THAT IS NOT CONTROLLED WITH YOUR NAUSEA MEDICATION  *UNUSUAL SHORTNESS OF BREATH  *UNUSUAL BRUISING OR BLEEDING  TENDERNESS IN MOUTH AND THROAT WITH OR WITHOUT PRESENCE OF ULCERS  *URINARY PROBLEMS  *BOWEL PROBLEMS  UNUSUAL RASH Items with * indicate a potential emergency and should be followed up as soon as possible.  Feel free to call the clinic you have any questions or concerns. The clinic phone number is [phone].  Please show the [place] at check-in to the Emergency Department and triage nurse.

## 2016-07-03 NOTE — Progress Notes (Signed)
Prescription for [facility]an sent to [facility] in [place] for thrush per order of Dr. Benay Spice.

## 2016-07-03 NOTE — Progress Notes (Signed)
Per Dr. Benay Spice, no CMP needed today for treatment.

## 2016-07-04 ENCOUNTER — Encounter: Payer: Self-pay | Admitting: *Deleted

## 2016-07-04 NOTE — Progress Notes (Signed)
Call placed to [phone] for peer to peer regarding PA for Neulasta.  Spoke with Art A and he stated that Neulasta has been authorized for 3 treatments from 07/05/16-12/19/16. Auth # [id number].  Pharmacy notified.

## 2016-07-05 ENCOUNTER — Ambulatory Visit (HOSPITAL_BASED_OUTPATIENT_CLINIC_OR_DEPARTMENT_OTHER): Payer: BLUE CROSS/BLUE SHIELD

## 2016-07-05 ENCOUNTER — Ambulatory Visit: Payer: BLUE CROSS/BLUE SHIELD

## 2016-07-05 VITALS — BP 113/64 | HR 76 | Temp 98.3°F | Resp 18

## 2016-07-05 DIAGNOSIS — Z5189 Encounter for other specified aftercare: Secondary | ICD-10-CM

## 2016-07-05 DIAGNOSIS — C182 Malignant neoplasm of ascending colon: Secondary | ICD-10-CM

## 2016-07-05 DIAGNOSIS — C187 Malignant neoplasm of sigmoid colon: Secondary | ICD-10-CM | POA: Diagnosis not present

## 2016-07-05 MED ORDER — SODIUM CHLORIDE 0.9% FLUSH
10.0000 mL | INTRAVENOUS | Status: DC | PRN
  Administered 2016-07-05: 10 mL
  Filled 2016-07-05: qty 10

## 2016-07-05 MED ORDER — HEPARIN SOD (PORK) LOCK FLUSH 100 UNIT/ML IV SOLN
500.0000 [IU] | Freq: Once | INTRAVENOUS | Status: AC | PRN
  Administered 2016-07-05: 500 [IU]
  Filled 2016-07-05: qty 5

## 2016-07-05 MED ORDER — PEGFILGRASTIM INJECTION 6 MG/0.6ML ~~LOC~~
6.0000 mg | PREFILLED_SYRINGE | Freq: Once | SUBCUTANEOUS | Status: AC
  Administered 2016-07-05: 6 mg via SUBCUTANEOUS
  Filled 2016-07-05: qty 0.6

## 2016-07-05 NOTE — Patient Instructions (Signed)
PICC Home Guide A peripherally inserted central catheter (PICC) is a long, thin, flexible tube that is inserted into a vein in the upper arm. It is a form of intravenous (IV) access. It is considered to be a "central" line because the tip of the PICC ends in a large vein in your chest. This large vein is called the superior vena cava (SVC). The PICC tip ends in the SVC because there is a lot of blood flow in the SVC. This allows medicines and IV fluids to be quickly distributed throughout the body. The PICC is inserted using a sterile technique by a specially trained nurse or physician. After the PICC is inserted, a chest X-ray exam is done to be sure it is in the correct place.  A PICC may be placed for different reasons, such as:  To give medicines and liquid nutrition that can only be given through a central line. Examples are:  Certain antibiotic treatments.  Chemotherapy.  Total parenteral nutrition (TPN).  To take frequent blood samples.  To give IV fluids and blood products.  If there is difficulty placing a peripheral intravenous (PIV) catheter. If taken care of properly, a PICC can remain in place for several months. A PICC can also allow a person to go home from the hospital early. Medicine and PICC care can be managed at home by a family member or home health care team. WHAT PROBLEMS CAN HAPPEN WHEN I HAVE A PICC? Problems with a PICC can occasionally occur. These may include the following:  A blood clot (thrombus) forming in or at the tip of the PICC. This can cause the PICC to become clogged. A clot-dissolving medicine called tissue plasminogen activator (tPA) can be given through the PICC to help break up the clot.  Inflammation of the vein (phlebitis) in which the PICC is placed. Signs of inflammation may include redness, pain at the insertion site, red streaks, or being able to feel a "cord" in the vein where the PICC is located.  Infection in the PICC or at the insertion  site. Signs of infection may include fever, chills, redness, swelling, or pus drainage from the PICC insertion site.  PICC movement (malposition). The PICC tip may move from its original position due to excessive physical activity, forceful coughing, sneezing, or vomiting.  A break or cut in the PICC. It is important to not use scissors near the PICC.  Nerve or tendon irritation or injury during PICC insertion. WHAT SHOULD I KEEP IN MIND ABOUT ACTIVITIES WHEN I HAVE A PICC?  You may bend your arm and move it freely. If your PICC is near or at the bend of your elbow, avoid activity with repeated motion at the elbow.  Rest at home for the remainder of the day following PICC line insertion.  Avoid lifting heavy objects as instructed by your health care provider.  Avoid using a crutch with the arm on the same side as your PICC. You may need to use a walker. WHAT SHOULD I KNOW ABOUT MY PICC DRESSING?  Keep your PICC bandage (dressing) clean and dry to prevent infection.  Ask your health care provider when you may shower. Ask your health care provider to teach you how to wrap the PICC when you do take a shower.  Change the PICC dressing as instructed by your health care provider.  Change your PICC dressing if it becomes loose or wet. WHAT SHOULD I KNOW ABOUT PICC CARE?  Check the PICC insertion site   daily for leakage, redness, swelling, or pain.  Do not take a bath, swim, or use hot tubs when you have a PICC. Cover PICC line with clear plastic wrap and tape to keep it dry while showering.  Flush the PICC as directed by your health care provider. Let your health care provider know right away if the PICC is difficult to flush or does not flush. Do not use force to flush the PICC.  Do not use a syringe that is less than 10 mL to flush the PICC.  Never pull or tug on the PICC.  Avoid blood pressure checks on the arm with the PICC.  Keep your PICC identification card with you at all  times.  Do not take the PICC out yourself. Only a trained clinical professional should remove the PICC. SEEK IMMEDIATE MEDICAL CARE IF:  Your PICC is accidentally pulled all the way out. If this happens, cover the insertion site with a bandage or gauze dressing. Do not throw the PICC away. Your health care provider will need to inspect it.  Your PICC was tugged or pulled and has partially come out. Do not  push the PICC back in.  There is any type of drainage, redness, or swelling where the PICC enters the skin.  You cannot flush the PICC, it is difficult to flush, or the PICC leaks around the insertion site when it is flushed.  You hear a "flushing" sound when the PICC is flushed.  You have pain, discomfort, or numbness in your arm, shoulder, or jaw on the same side as the PICC.  You feel your heart "racing" or skipping beats.  You notice a hole or tear in the PICC.  You develop chills or a fever. MAKE SURE YOU:   Understand these instructions.  Will watch your condition.  Will get help right away if you are not doing well or get worse.   This information is not intended to replace advice given to you by your health care provider. Make sure you discuss any questions you have with your health care provider.   Document Released: 02/17/2003 Document Revised: 09/03/2014 Document Reviewed: 04/20/2013 Elsevier Interactive Patient Education 2016 Elsevier Inc.   Pegfilgrastim injection What is this medicine? PEGFILGRASTIM (PEG fil gra stim) is a long-acting granulocyte colony-stimulating factor that stimulates the growth of neutrophils, a type of white blood cell important in the body's fight against infection. It is used to reduce the incidence of fever and infection in patients with certain types of cancer who are receiving chemotherapy that affects the bone marrow, and to increase survival after being exposed to high doses of radiation. This medicine may be used for other  purposes; ask your health care provider or pharmacist if you have questions. What should I tell my health care provider before I take this medicine? They need to know if you have any of these conditions: -kidney disease -latex allergy -ongoing radiation therapy -sickle cell disease -skin reactions to acrylic adhesives (On-Body Injector only) -an unusual or allergic reaction to pegfilgrastim, filgrastim, other medicines, foods, dyes, or preservatives -pregnant or trying to get pregnant -breast-feeding How should I use this medicine? This medicine is for injection under the skin. If you get this medicine at home, you will be taught how to prepare and give the pre-filled syringe or how to use the On-body Injector. Refer to the patient Instructions for Use for detailed instructions. Use exactly as directed. Take your medicine at regular intervals. Do not take your  medicine more often than directed. It is important that you put your used needles and syringes in a special sharps container. Do not put them in a trash can. If you do not have a sharps container, call your pharmacist or healthcare provider to get one. Talk to your pediatrician regarding the use of this medicine in children. While this drug may be prescribed for selected conditions, precautions do apply. Overdosage: If you think you have taken too much of this medicine contact a poison control center or emergency room at once. NOTE: This medicine is only for you. Do not share this medicine with others. What if I miss a dose? It is important not to miss your dose. Call your doctor or health care professional if you miss your dose. If you miss a dose due to an On-body Injector failure or leakage, a new dose should be administered as soon as possible using a single prefilled syringe for manual use. What may interact with this medicine? Interactions have not been studied. Give your health care provider a list of all the medicines, herbs,  non-prescription drugs, or dietary supplements you use. Also tell them if you smoke, drink alcohol, or use illegal drugs. Some items may interact with your medicine. This list may not describe all possible interactions. Give your health care provider a list of all the medicines, herbs, non-prescription drugs, or dietary supplements you use. Also tell them if you smoke, drink alcohol, or use illegal drugs. Some items may interact with your medicine. What should I watch for while using this medicine? You may need blood work done while you are taking this medicine. If you are going to need a MRI, CT scan, or other procedure, tell your doctor that you are using this medicine (On-Body Injector only). What side effects may I notice from receiving this medicine? Side effects that you should report to your doctor or health care professional as soon as possible: -allergic reactions like skin rash, itching or hives, swelling of the face, lips, or tongue -dizziness -fever -pain, redness, or irritation at site where injected -pinpoint red spots on the skin -red or dark-brown urine -shortness of breath or breathing problems -stomach or side pain, or pain at the shoulder -swelling -tiredness -trouble passing urine or change in the amount of urine Side effects that usually do not require medical attention (report to your doctor or health care professional if they continue or are bothersome): -bone pain -muscle pain This list may not describe all possible side effects. Call your doctor for medical advice about side effects. You may report side effects to FDA at 1-800-FDA-1088. Where should I keep my medicine? Keep out of the reach of children. Store pre-filled syringes in a refrigerator between 2 and 8 degrees C (36 and 46 degrees F). Do not freeze. Keep in carton to protect from light. Throw away this medicine if it is left out of the refrigerator for more than 48 hours. Throw away any unused medicine after  the expiration date. NOTE: This sheet is a summary. It may not cover all possible information. If you have questions about this medicine, talk to your doctor, pharmacist, or health care provider.    2016, Elsevier/Gold Standard. (2014-09-02 14:30:14)

## 2016-07-10 ENCOUNTER — Ambulatory Visit (HOSPITAL_BASED_OUTPATIENT_CLINIC_OR_DEPARTMENT_OTHER): Payer: BLUE CROSS/BLUE SHIELD

## 2016-07-10 DIAGNOSIS — C187 Malignant neoplasm of sigmoid colon: Secondary | ICD-10-CM | POA: Diagnosis not present

## 2016-07-10 DIAGNOSIS — Z452 Encounter for adjustment and management of vascular access device: Secondary | ICD-10-CM

## 2016-07-10 MED ORDER — MAGIC MOUTHWASH: 5.0000 mL | Freq: Four times a day (QID) | ORAL | 0 refills | Status: DC | PRN

## 2016-07-10 MED ORDER — FLUCONAZOLE 100 MG PO TABS: 100.0000 mg | ORAL_TABLET | Freq: Every day | ORAL | 0 refills | Status: DC

## 2016-07-10 MED ORDER — SODIUM CHLORIDE 0.9 % IJ SOLN
10.0000 mL | Freq: Once | INTRAMUSCULAR | Status: AC
  Administered 2016-07-10: 10 mL via INTRAVENOUS
  Filled 2016-07-10: qty 10

## 2016-07-10 MED ORDER — HEPARIN SOD (PORK) LOCK FLUSH 100 UNIT/ML IV SOLN
250.0000 [IU] | Freq: Once | INTRAVENOUS | Status: AC
  Administered 2016-07-10: 13:00:00 via INTRAVENOUS
  Filled 2016-07-10: qty 5

## 2016-07-10 NOTE — Progress Notes (Signed)
Notified by Flush room nurse that pt is requesting magic mouthwash. Assessed pt's mouth, white patch noted at back of tongue. Pt reports she has a bad taste in her mouth making it difficult to eat. Feels like the same thing is going down her throat.  Discussed with Dr. Benay Spice: Order received to have pt repeat Diflucan (one tablet daily for 4 days). Magic mouthwash swish and spit QID. Discussed instructions with pt, she voiced understanding.  Requests meds to be [facility] to [facility]. Same done.

## 2016-07-10 NOTE — Addendum Note (Signed)
Addended by: Rosalio Macadamia C on: 07/10/2016 01:18 PM   Modules accepted: Orders

## 2016-07-15 ENCOUNTER — Other Ambulatory Visit: Payer: Self-pay | Admitting: Oncology

## 2016-07-16 ENCOUNTER — Telehealth: Payer: Self-pay | Admitting: *Deleted

## 2016-07-16 ENCOUNTER — Other Ambulatory Visit (HOSPITAL_BASED_OUTPATIENT_CLINIC_OR_DEPARTMENT_OTHER): Payer: BLUE CROSS/BLUE SHIELD

## 2016-07-16 ENCOUNTER — Encounter: Payer: Self-pay | Admitting: *Deleted

## 2016-07-16 ENCOUNTER — Ambulatory Visit (HOSPITAL_BASED_OUTPATIENT_CLINIC_OR_DEPARTMENT_OTHER): Payer: BLUE CROSS/BLUE SHIELD | Admitting: Nurse Practitioner

## 2016-07-16 ENCOUNTER — Ambulatory Visit: Payer: BLUE CROSS/BLUE SHIELD | Admitting: Nutrition

## 2016-07-16 ENCOUNTER — Ambulatory Visit (HOSPITAL_BASED_OUTPATIENT_CLINIC_OR_DEPARTMENT_OTHER): Payer: BLUE CROSS/BLUE SHIELD

## 2016-07-16 ENCOUNTER — Telehealth: Payer: Self-pay

## 2016-07-16 ENCOUNTER — Ambulatory Visit: Payer: BLUE CROSS/BLUE SHIELD

## 2016-07-16 VITALS — BP 122/58 | HR 87 | Temp 98.1°F | Resp 17 | Ht 66.0 in | Wt 180.1 lb

## 2016-07-16 DIAGNOSIS — C187 Malignant neoplasm of sigmoid colon: Secondary | ICD-10-CM

## 2016-07-16 DIAGNOSIS — I81 Portal vein thrombosis: Secondary | ICD-10-CM | POA: Diagnosis not present

## 2016-07-16 DIAGNOSIS — D6959 Other secondary thrombocytopenia: Secondary | ICD-10-CM

## 2016-07-16 DIAGNOSIS — D701 Agranulocytosis secondary to cancer chemotherapy: Secondary | ICD-10-CM

## 2016-07-16 DIAGNOSIS — Z5111 Encounter for antineoplastic chemotherapy: Secondary | ICD-10-CM | POA: Diagnosis not present

## 2016-07-16 DIAGNOSIS — C182 Malignant neoplasm of ascending colon: Secondary | ICD-10-CM

## 2016-07-16 LAB — CBC WITH DIFFERENTIAL/PLATELET
BASO%: 0.7 % (ref 0.0–2.0)
BASOS ABS: 0 10*3/uL (ref 0.0–0.1)
EOS%: 3.9 % (ref 0.0–7.0)
Eosinophils Absolute: 0.2 10*3/uL (ref 0.0–0.5)
HEMATOCRIT: 32.5 % — AB (ref 34.8–46.6)
HEMOGLOBIN: 10.7 g/dL — AB (ref 11.6–15.9)
LYMPH#: 0.8 10*3/uL — AB (ref 0.9–3.3)
LYMPH%: 19.3 % (ref 14.0–49.7)
MCH: 28.8 pg (ref 25.1–34.0)
MCHC: 33 g/dL (ref 31.5–36.0)
MCV: 87.5 fL (ref 79.5–101.0)
MONO#: 0.3 10*3/uL (ref 0.1–0.9)
MONO%: 6.9 % (ref 0.0–14.0)
NEUT%: 69.2 % (ref 38.4–76.8)
NEUTROS ABS: 2.7 10*3/uL (ref 1.5–6.5)
Platelets: 80 10*3/uL — ABNORMAL LOW (ref 145–400)
RBC: 3.72 10*6/uL (ref 3.70–5.45)
RDW: 16.9 % — AB (ref 11.2–14.5)
WBC: 4 10*3/uL (ref 3.9–10.3)

## 2016-07-16 LAB — COMPREHENSIVE METABOLIC PANEL
ALBUMIN: 3.1 g/dL — AB (ref 3.5–5.0)
ALK PHOS: 158 U/L — AB (ref 40–150)
ALT: 39 U/L (ref 0–55)
AST: 34 U/L (ref 5–34)
Anion Gap: 9 mEq/L (ref 3–11)
BUN: 8.6 mg/dL (ref 7.0–26.0)
CALCIUM: 8.9 mg/dL (ref 8.4–10.4)
CO2: 24 mEq/L (ref 22–29)
CREATININE: 0.9 mg/dL (ref 0.6–1.1)
Chloride: 109 mEq/L (ref 98–109)
EGFR: 73 mL/min/{1.73_m2} — ABNORMAL LOW (ref >90)
GLUCOSE: 197 mg/dL — AB (ref 70–140)
Potassium: 3.1 mEq/L — ABNORMAL LOW (ref 3.5–5.1)
SODIUM: 141 meq/L (ref 136–145)
Total Bilirubin: 0.4 mg/dL (ref 0.20–1.20)
Total Protein: 6.7 g/dL (ref 6.4–8.3)

## 2016-07-16 MED ORDER — DEXTROSE 5 % IV SOLN
Freq: Once | INTRAVENOUS | Status: AC
  Administered 2016-07-16: 11:00:00 via INTRAVENOUS

## 2016-07-16 MED ORDER — SODIUM CHLORIDE 0.9 % IV SOLN
2400.0000 mg/m2 | INTRAVENOUS | Status: DC
  Administered 2016-07-16: 4750 mg via INTRAVENOUS
  Filled 2016-07-16: qty 95

## 2016-07-16 MED ORDER — OXALIPLATIN CHEMO INJECTION 100 MG/20ML
65.0000 mg/m2 | Freq: Once | INTRAVENOUS | Status: AC
  Administered 2016-07-16: 130 mg via INTRAVENOUS
  Filled 2016-07-16: qty 20

## 2016-07-16 MED ORDER — DEXAMETHASONE SODIUM PHOSPHATE 10 MG/ML IJ SOLN
INTRAMUSCULAR | Status: AC
  Filled 2016-07-16: qty 1

## 2016-07-16 MED ORDER — POTASSIUM CHLORIDE CRYS ER 20 MEQ PO TBCR: EXTENDED_RELEASE_TABLET | ORAL | 0 refills | Status: DC

## 2016-07-16 MED ORDER — PALONOSETRON HCL INJECTION 0.25 MG/5ML
INTRAVENOUS | Status: AC
  Filled 2016-07-16: qty 5

## 2016-07-16 MED ORDER — FLUOROURACIL CHEMO INJECTION 2.5 GM/50ML
400.0000 mg/m2 | Freq: Once | INTRAVENOUS | Status: AC
  Administered 2016-07-16: 800 mg via INTRAVENOUS
  Filled 2016-07-16: qty 16

## 2016-07-16 MED ORDER — LEUCOVORIN CALCIUM INJECTION 350 MG
400.0000 mg/m2 | Freq: Once | INTRAVENOUS | Status: AC
  Administered 2016-07-16: 792 mg via INTRAVENOUS
  Filled 2016-07-16: qty 39.6

## 2016-07-16 MED ORDER — PALONOSETRON HCL INJECTION 0.25 MG/5ML
0.2500 mg | Freq: Once | INTRAVENOUS | Status: AC
  Administered 2016-07-16: 0.25 mg via INTRAVENOUS

## 2016-07-16 MED ORDER — DEXAMETHASONE SODIUM PHOSPHATE 10 MG/ML IJ SOLN
10.0000 mg | Freq: Once | INTRAMUSCULAR | Status: AC
  Administered 2016-07-16: 10 mg via INTRAVENOUS

## 2016-07-16 NOTE — Progress Notes (Signed)
Oncology Nurse Navigator Documentation  Oncology Nurse Navigator Flowsheets 07/16/2016  Navigator Location [facility]  Referral date to RadOnc/MedOnc -  Navigator Encounter Type Treatment  Abnormal Finding Date -  Confirmed Diagnosis Date -  Surgery Date -  Genetic Counseling Date (No Data)--patient declines and requested the appointment be cancelled  Treatment Initiated Date -  Patient Visit Type MedOnc  Treatment Phase Active Tx--FOLFOX #3  Barriers/Navigation Needs Coordination of Care;Education  Education Other--made her daughter aware with her family H/O colon and breast cancer, she needs a genetics appointment. Informed her to ask PCP to make the referral for her. Mammogram at 12 is OK. Suggested she have a colonoscopy soon as well, since her sister had colon cancer at age 29 and now her mom (patient has colon cancer).  Interventions Coordination of Care--diabetes diet managment  Coordination of Care Appts--scheduled for dietician to see her in tx area today  Education Method -  Support Groups/Services -  Acuity -  Time Spent with Patient 15

## 2016-07-16 NOTE — Telephone Encounter (Signed)
Called [facility], pt brought in d/c summary with noted compazine and phenergan to be discharged. Pt unsure why. Spoke with Jenny Reichmann B, who was unable to find anything in the providers notes, routed message to provider.  Call back stating pt may take compazine and/or phenergan, they believe pt stated she just wasn't taking them at the time and Dr. Benjamine Mola does not have any indication that she cannot take them.  Spoke with pt who understands to take phenergan for 1st three days after treatment. And  As needed

## 2016-07-16 NOTE — Patient Instructions (Signed)
[facility] Discharge Instructions for Patients Receiving Chemotherapy  Today you received the following chemotherapy agents:  Oxaliplatin, Leucovorin, Fluorouracil  To help prevent nausea and vomiting after your treatment, we encourage you to take your nausea medication as prescribed.   If you develop nausea and vomiting that is not controlled by your nausea medication, call the clinic.   BELOW ARE SYMPTOMS THAT SHOULD BE REPORTED IMMEDIATELY:  *FEVER GREATER THAN 100.5 F  *CHILLS WITH OR WITHOUT FEVER  NAUSEA AND VOMITING THAT IS NOT CONTROLLED WITH YOUR NAUSEA MEDICATION  *UNUSUAL SHORTNESS OF BREATH  *UNUSUAL BRUISING OR BLEEDING  TENDERNESS IN MOUTH AND THROAT WITH OR WITHOUT PRESENCE OF ULCERS  *URINARY PROBLEMS  *BOWEL PROBLEMS  UNUSUAL RASH Items with * indicate a potential emergency and should be followed up as soon as possible.  Feel free to call the clinic you have any questions or concerns. The clinic phone number is [phone].  Please show the CHEMO ALERT CARD at check-in to the Emergency Department and triage nurse.   

## 2016-07-16 NOTE — Progress Notes (Signed)
Per Orland Penman, per Dr. Benay Spice pt is ok to treat with platelets of 80.  Wylene Simmer, BSN, RN 07/16/2016 10:58 AM

## 2016-07-16 NOTE — Progress Notes (Signed)
Was asked to see patient today in the infusion room. Patient is a 61 year old female diagnosed with colon cancer status post colectomy.  Past medical history includes thrombosis.  Medications include Magic mouthwash, Diflucan, Zofran, Protonix, Compazine, and Phenergan.  Labs include glucose 197.  Height: 5 feet 6 inches. Weight: 180 pounds. Usual body weight: 185 pounds. BMI: 29.07.  Was asked to see patient by nurse navigator today to review no concentrated sweets diet. Patient denies that she has been diagnosed with diabetes. She understands that steroids can increase blood sugars. She reports she does not consume sweetened beverages or juices. She does enjoy sweets on occasion. She has had nausea a few times.  However, Phenergan, is effective.  Nutrition diagnosis: Food and nutrition related knowledge deficit related to colon cancer and associated treatments as evidenced by no prior need for nutrition related information.  Intervention: Educated patient on No concentrated sweets diet. Encouraged small frequent high-protein meals and snacks. Encouraged weight maintenance throughout treatment. Reviewed the role of protein and fiber controlling blood sugars. Fact sheets were provided.  Questions were answered.  Teach back method used.  Contact information given.  Monitoring, evaluation, goals:  Patient will tolerate adequate calories and protein for weight maintenance adequate glycemic control.  Next visit: No follow-up required.  Patient has my contact information.  **Disclaimer: This note was dictated with voice recognition software. Similar sounding words can inadvertently be transcribed and this note may contain transcription errors which may not have been corrected upon publication of note.**

## 2016-07-16 NOTE — Telephone Encounter (Signed)
Per LOS I have scheduled appts and notified the scheduler 

## 2016-07-16 NOTE — Progress Notes (Signed)
  [place] OFFICE PROGRESS NOTE   Diagnosis:  Colon cancer  INTERVAL HISTORY:   Ms. Lacson returns as scheduled. She completed cycle 2 FOLFOX 07/03/2016. She received Neulasta support. She had a single episode of nausea/vomiting. Phenergan is effective for nausea. She had mouth sores which resolved with Diflucan. No diarrhea. No cold sensitivity. No numbness or tingling today. No bone pain following Neulasta. No bleeding.  Objective:  Vital signs in last 24 hours:  Blood pressure (!) 122/58, pulse 87, temperature 98.1 F (36.7 C), temperature source Oral, resp. rate 17, height '5\' 6"'$  (1.676 m), weight 180 lb 1.6 oz (81.7 kg), SpO2 100 %.    HEENT: No thrush or ulcers. Resp: Lungs clear bilaterally. Cardio: Regular rate and rhythm. GI: Abdomen soft and nontender. No hepatomegaly. Vascular: No leg edema. Right upper extremity PICC without erythema.  Lab Results:  Lab Results  Component Value Date   WBC 4.0 07/16/2016   HGB 10.7 (L) 07/16/2016   HCT 32.5 (L) 07/16/2016   MCV 87.5 07/16/2016   PLT 80 (L) 07/16/2016   NEUTROABS 2.7 07/16/2016    Imaging:  No results found.  Medications: I have reviewed the patient's current medications.  Assessment/Plan: 1. 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 1/14 lymph nodes positive for metastatic carcinoma ? No loss of mismatch repair protein expression ? Cycle 1 adjuvant FOLFOX 06/13/2016 ? Cycle 2 adjuvant FOLFOX 07/03/2016 with Neulasta support ? Cycle 3 adjuvant FOLFOX 07/16/2016 with Neulasta support (oxaliplatin dose reduced to 65 mg/m due to thrombocytopenia)  2. H. pylori diagnosed on upper endoscopy 04/23/2016 3. 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 Family history of "blood clots"-hypercoagulable panel submitted 06/13/2016 6. PICC  placement 06/13/2016 7. Neutropenia secondary to chemotherapy 8. Thrombocytopenia secondary to chemotherapy   Disposition: Ms. Kocourek appears stable. She has completed 2 cycles of FOLFOX. Plan to proceed with cycle 3 today as scheduled. The oxaliplatin will be dose reduced to 65 mg/m due to thrombocytopenia.  We reviewed thrombocytopenic precautions. She understands to contact the office with any bruising/bleeding.  Potassium returned low on today's labs. Prescription sent to her pharmacy for Kdur 20 meq twice daily for 2 days then 20 meq daily.  We reviewed the elevated glucose on today's labs. She will avoid concentrated sweets. We will ask the dietitian to meet with her as well.  She will return for a follow-up visit and cycle 4 adjuvant FOLFOX in 2 weeks. She will contact the office in the interim as outlined above or with any other problems.  Plan reviewed with Dr. Benay Spice. 25 minutes were spent face-to-face at today's visit with the majority of that time involved in counseling/coordination of care.  Ned Card ANP/GNP-BC   07/16/2016  10:09 AM

## 2016-07-17 ENCOUNTER — Other Ambulatory Visit: Payer: BLUE CROSS/BLUE SHIELD

## 2016-07-17 ENCOUNTER — Encounter: Payer: BLUE CROSS/BLUE SHIELD | Admitting: Genetic Counselor

## 2016-07-18 ENCOUNTER — Ambulatory Visit: Payer: BLUE CROSS/BLUE SHIELD

## 2016-07-18 ENCOUNTER — Ambulatory Visit (HOSPITAL_BASED_OUTPATIENT_CLINIC_OR_DEPARTMENT_OTHER): Payer: BLUE CROSS/BLUE SHIELD

## 2016-07-18 VITALS — BP 115/59 | HR 65 | Temp 97.6°F | Resp 18

## 2016-07-18 DIAGNOSIS — C187 Malignant neoplasm of sigmoid colon: Secondary | ICD-10-CM

## 2016-07-18 DIAGNOSIS — C182 Malignant neoplasm of ascending colon: Secondary | ICD-10-CM

## 2016-07-18 DIAGNOSIS — D701 Agranulocytosis secondary to cancer chemotherapy: Secondary | ICD-10-CM

## 2016-07-18 MED ORDER — PEGFILGRASTIM INJECTION 6 MG/0.6ML ~~LOC~~
6.0000 mg | PREFILLED_SYRINGE | Freq: Once | SUBCUTANEOUS | Status: AC
  Administered 2016-07-18: 6 mg via SUBCUTANEOUS
  Filled 2016-07-18: qty 0.6

## 2016-07-18 MED ORDER — HEPARIN SOD (PORK) LOCK FLUSH 100 UNIT/ML IV SOLN
500.0000 [IU] | Freq: Once | INTRAVENOUS | Status: AC | PRN
  Administered 2016-07-18: 500 [IU]
  Filled 2016-07-18: qty 5

## 2016-07-18 MED ORDER — SODIUM CHLORIDE 0.9% FLUSH
10.0000 mL | INTRAVENOUS | Status: DC | PRN
  Administered 2016-07-18: 10 mL
  Filled 2016-07-18: qty 10

## 2016-07-18 NOTE — Patient Instructions (Signed)

## 2016-07-25 ENCOUNTER — Ambulatory Visit (HOSPITAL_BASED_OUTPATIENT_CLINIC_OR_DEPARTMENT_OTHER): Payer: BLUE CROSS/BLUE SHIELD

## 2016-07-25 DIAGNOSIS — C187 Malignant neoplasm of sigmoid colon: Secondary | ICD-10-CM | POA: Diagnosis not present

## 2016-07-25 DIAGNOSIS — Z452 Encounter for adjustment and management of vascular access device: Secondary | ICD-10-CM

## 2016-07-25 MED ORDER — SODIUM CHLORIDE 0.9% FLUSH
10.0000 mL | Freq: Once | INTRAVENOUS | Status: AC
  Administered 2016-07-25: 10 mL via INTRAVENOUS
  Filled 2016-07-25: qty 10

## 2016-07-25 MED ORDER — HEPARIN SOD (PORK) LOCK FLUSH 100 UNIT/ML IV SOLN
500.0000 [IU] | Freq: Once | INTRAVENOUS | Status: AC
  Administered 2016-07-25: 250 [IU] via INTRAVENOUS
  Filled 2016-07-25: qty 5

## 2016-07-26 ENCOUNTER — Telehealth: Payer: Self-pay | Admitting: *Deleted

## 2016-07-26 NOTE — Telephone Encounter (Signed)
Pt requested Fluconazole refill during PICC maintenance appointment on 07/25/16. Pt requests to take prophylactic Fluconazole for thrush after next treatment. Denied any mouth sores or white patches at current time.  Left message on voicemail informing her that repeated Fluconazole treatments can have adverse effects.  Provider will evaluate pt 12/6 and make a recommendation.

## 2016-07-29 ENCOUNTER — Other Ambulatory Visit: Payer: Self-pay | Admitting: Oncology

## 2016-08-01 ENCOUNTER — Ambulatory Visit (HOSPITAL_BASED_OUTPATIENT_CLINIC_OR_DEPARTMENT_OTHER): Payer: BLUE CROSS/BLUE SHIELD

## 2016-08-01 ENCOUNTER — Telehealth: Payer: Self-pay | Admitting: *Deleted

## 2016-08-01 ENCOUNTER — Ambulatory Visit: Payer: BLUE CROSS/BLUE SHIELD

## 2016-08-01 ENCOUNTER — Other Ambulatory Visit (HOSPITAL_BASED_OUTPATIENT_CLINIC_OR_DEPARTMENT_OTHER): Payer: BLUE CROSS/BLUE SHIELD

## 2016-08-01 ENCOUNTER — Ambulatory Visit (HOSPITAL_BASED_OUTPATIENT_CLINIC_OR_DEPARTMENT_OTHER): Payer: BLUE CROSS/BLUE SHIELD | Admitting: Nurse Practitioner

## 2016-08-01 VITALS — BP 118/45 | HR 78 | Temp 98.2°F | Resp 17 | Ht 66.0 in | Wt 177.6 lb

## 2016-08-01 DIAGNOSIS — D6959 Other secondary thrombocytopenia: Secondary | ICD-10-CM | POA: Diagnosis not present

## 2016-08-01 DIAGNOSIS — C187 Malignant neoplasm of sigmoid colon: Secondary | ICD-10-CM

## 2016-08-01 DIAGNOSIS — Z5111 Encounter for antineoplastic chemotherapy: Secondary | ICD-10-CM | POA: Diagnosis not present

## 2016-08-01 DIAGNOSIS — I81 Portal vein thrombosis: Secondary | ICD-10-CM | POA: Diagnosis not present

## 2016-08-01 DIAGNOSIS — Z95828 Presence of other vascular implants and grafts: Secondary | ICD-10-CM

## 2016-08-01 DIAGNOSIS — Z800 Family history of malignant neoplasm of digestive organs: Secondary | ICD-10-CM | POA: Diagnosis not present

## 2016-08-01 DIAGNOSIS — C182 Malignant neoplasm of ascending colon: Secondary | ICD-10-CM

## 2016-08-01 LAB — COMPREHENSIVE METABOLIC PANEL
ALBUMIN: 3.2 g/dL — AB (ref 3.5–5.0)
ALK PHOS: 172 U/L — AB (ref 40–150)
ALT: 32 U/L (ref 0–55)
ANION GAP: 9 meq/L (ref 3–11)
AST: 30 U/L (ref 5–34)
BILIRUBIN TOTAL: 0.52 mg/dL (ref 0.20–1.20)
BUN: 9.2 mg/dL (ref 7.0–26.0)
CO2: 24 meq/L (ref 22–29)
Calcium: 9.3 mg/dL (ref 8.4–10.4)
Chloride: 107 mEq/L (ref 98–109)
Creatinine: 0.8 mg/dL (ref 0.6–1.1)
EGFR: 79 mL/min/{1.73_m2} — AB (ref >90)
GLUCOSE: 117 mg/dL (ref 70–140)
POTASSIUM: 4.2 meq/L (ref 3.5–5.1)
SODIUM: 140 meq/L (ref 136–145)
Total Protein: 7.2 g/dL (ref 6.4–8.3)

## 2016-08-01 LAB — CBC WITH DIFFERENTIAL/PLATELET
BASO%: 0.7 % (ref 0.0–2.0)
BASOS ABS: 0 10*3/uL (ref 0.0–0.1)
EOS ABS: 0.2 10*3/uL (ref 0.0–0.5)
EOS%: 3.5 % (ref 0.0–7.0)
HCT: 35 % (ref 34.8–46.6)
HGB: 11.4 g/dL — ABNORMAL LOW (ref 11.6–15.9)
LYMPH%: 21.6 % (ref 14.0–49.7)
MCH: 29.2 pg (ref 25.1–34.0)
MCHC: 32.5 g/dL (ref 31.5–36.0)
MCV: 89.9 fL (ref 79.5–101.0)
MONO#: 0.5 10*3/uL (ref 0.1–0.9)
MONO%: 11 % (ref 0.0–14.0)
NEUT#: 3 10*3/uL (ref 1.5–6.5)
NEUT%: 63.2 % (ref 38.4–76.8)
PLATELETS: 97 10*3/uL — AB (ref 145–400)
RBC: 3.89 10*6/uL (ref 3.70–5.45)
RDW: 20.1 % — ABNORMAL HIGH (ref 11.2–14.5)
WBC: 4.8 10*3/uL (ref 3.9–10.3)
lymph#: 1 10*3/uL (ref 0.9–3.3)

## 2016-08-01 MED ORDER — DEXAMETHASONE SODIUM PHOSPHATE 10 MG/ML IJ SOLN
INTRAMUSCULAR | Status: AC
  Filled 2016-08-01: qty 1

## 2016-08-01 MED ORDER — DEXTROSE 5 % IV SOLN
Freq: Once | INTRAVENOUS | Status: AC
  Administered 2016-08-01: 13:00:00 via INTRAVENOUS

## 2016-08-01 MED ORDER — DEXAMETHASONE SODIUM PHOSPHATE 10 MG/ML IJ SOLN
10.0000 mg | Freq: Once | INTRAMUSCULAR | Status: AC
  Administered 2016-08-01: 10 mg via INTRAVENOUS

## 2016-08-01 MED ORDER — SODIUM CHLORIDE 0.9% FLUSH
10.0000 mL | INTRAVENOUS | Status: DC | PRN
  Administered 2016-08-01: 10 mL via INTRAVENOUS
  Filled 2016-08-01: qty 10

## 2016-08-01 MED ORDER — SODIUM CHLORIDE 0.9% FLUSH
10.0000 mL | INTRAVENOUS | Status: DC | PRN
  Filled 2016-08-01: qty 10

## 2016-08-01 MED ORDER — PALONOSETRON HCL INJECTION 0.25 MG/5ML
0.2500 mg | Freq: Once | INTRAVENOUS | Status: AC
  Administered 2016-08-01: 0.25 mg via INTRAVENOUS

## 2016-08-01 MED ORDER — HEPARIN SOD (PORK) LOCK FLUSH 100 UNIT/ML IV SOLN
250.0000 [IU] | Freq: Once | INTRAVENOUS | Status: DC | PRN
  Filled 2016-08-01: qty 5

## 2016-08-01 MED ORDER — OXALIPLATIN CHEMO INJECTION 100 MG/20ML
65.0000 mg/m2 | Freq: Once | INTRAVENOUS | Status: AC
  Administered 2016-08-01: 130 mg via INTRAVENOUS
  Filled 2016-08-01: qty 20

## 2016-08-01 MED ORDER — LEUCOVORIN CALCIUM INJECTION 350 MG
400.0000 mg/m2 | Freq: Once | INTRAVENOUS | Status: AC
  Administered 2016-08-01: 792 mg via INTRAVENOUS
  Filled 2016-08-01: qty 39.6

## 2016-08-01 MED ORDER — FLUCONAZOLE 100 MG PO TABS: 100.0000 mg | ORAL_TABLET | Freq: Every day | ORAL | 0 refills | Status: DC

## 2016-08-01 MED ORDER — SODIUM CHLORIDE 0.9 % IV SOLN
2400.0000 mg/m2 | INTRAVENOUS | Status: DC
  Administered 2016-08-01: 4750 mg via INTRAVENOUS
  Filled 2016-08-01: qty 95

## 2016-08-01 MED ORDER — PALONOSETRON HCL INJECTION 0.25 MG/5ML
INTRAVENOUS | Status: AC
  Filled 2016-08-01: qty 5

## 2016-08-01 MED ORDER — FLUOROURACIL CHEMO INJECTION 2.5 GM/50ML
400.0000 mg/m2 | Freq: Once | INTRAVENOUS | Status: AC
  Administered 2016-08-01: 800 mg via INTRAVENOUS
  Filled 2016-08-01: qty 16

## 2016-08-01 NOTE — Patient Instructions (Signed)
[facility] Discharge Instructions for Patients Receiving Chemotherapy  Today you received the following chemotherapy agents:  Oxaliplatin, Leucovorin, Fluorouracil  To help prevent nausea and vomiting after your treatment, we encourage you to take your nausea medication as prescribed.   If you develop nausea and vomiting that is not controlled by your nausea medication, call the clinic.   BELOW ARE SYMPTOMS THAT SHOULD BE REPORTED IMMEDIATELY:  *FEVER GREATER THAN 100.5 F  *CHILLS WITH OR WITHOUT FEVER  NAUSEA AND VOMITING THAT IS NOT CONTROLLED WITH YOUR NAUSEA MEDICATION  *UNUSUAL SHORTNESS OF BREATH  *UNUSUAL BRUISING OR BLEEDING  TENDERNESS IN MOUTH AND THROAT WITH OR WITHOUT PRESENCE OF ULCERS  *URINARY PROBLEMS  *BOWEL PROBLEMS  UNUSUAL RASH Items with * indicate a potential emergency and should be followed up as soon as possible.  Feel free to call the clinic you have any questions or concerns. The clinic phone number is [phone].  Please show the CHEMO ALERT CARD at check-in to the Emergency Department and triage nurse.   

## 2016-08-01 NOTE — Progress Notes (Signed)
Platelet count of 97.  Ned Card, NP advised ok to treat.

## 2016-08-01 NOTE — Telephone Encounter (Signed)
Per LOS I have scheduled appts and notified the schduler

## 2016-08-01 NOTE — Progress Notes (Signed)
  [place] OFFICE PROGRESS NOTE   Diagnosis:  Colon cancer  INTERVAL HISTORY:   Ms. Seiple returns as scheduled. She completed cycle 3 FOLFOX 07/16/2016. She denies nausea/vomiting. She developed mouth soreness. This resolved following a 4 day course of Diflucan. No diarrhea. She had minimal cold sensitivity. No persistent neuropathy symptoms. She had a few nosebleeds.  Objective:  Vital signs in last 24 hours:  Blood pressure (!) 118/45, pulse 78, temperature 98.2 F (36.8 C), temperature source Oral, resp. rate 17, height '5\' 6"'$  (1.676 m), weight 177 lb 9.6 oz (80.6 kg), SpO2 99 %.    HEENT: White coating over tongue. Resp: Lungs clear bilaterally. Cardio: Regular rate and rhythm. GI: Abdomen soft and nontender. No hepatomegaly. Vascular: No leg edema. Neuro: Vibratory sense minimally decreased over the fingertips on the left hand, intact over the fingertips on the right hand.  Skin: No rash. Right upper extremity PICC without erythema.    Lab Results:  Lab Results  Component Value Date   WBC 4.8 08/01/2016   HGB 11.4 (L) 08/01/2016   HCT 35.0 08/01/2016   MCV 89.9 08/01/2016   PLT 97 (L) 08/01/2016   NEUTROABS 3.0 08/01/2016    Imaging:  No results found.  Medications: I have reviewed the patient's current medications.  Assessment/Plan: 1. 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 1/14 lymph nodes positive for metastatic carcinoma ? No loss of mismatch repair protein expression ? Cycle 1 adjuvant FOLFOX 06/13/2016 ? Cycle 2 adjuvant FOLFOX 07/03/2016 with Neulasta support ? Cycle 3 adjuvant FOLFOX 07/16/2016 with Neulasta support (oxaliplatin dose reduced to 65 mg/m due to thrombocytopenia) ? Cycle 4 adjuvant FOLFOX 08/01/2016 with Neulasta support  2. H. pylori diagnosed on upper endoscopy 04/23/2016 3. 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 Family history of "blood clots"-hypercoagulable panel submitted 06/13/2016 6. PICC placement 06/13/2016 7. Neutropenia secondary to chemotherapy 8. Thrombocytopenia secondary to chemotherapy   Disposition: Ms. Carbo appears stable. She has completed 3 cycles of adjuvant FOLFOX. Plan to proceed with cycle 4 today as scheduled.  She has mild thrombocytopenia on labs today. She understands to contact the office with any bleeding. Of note, oxaliplatin was dose reduced beginning with cycle 3 due to thrombocytopenia.  She will return for a follow-up visit and cycle 5 in 2 weeks. She will contact the office in the interim with any problems. We specifically discussed bleeding.   Ned Card ANP/GNP-BC   08/01/2016  12:29 PM

## 2016-08-01 NOTE — Progress Notes (Signed)
Shelbie Ammons. RN gave patient Lavender aromatherapy inhalation to help with relaxation & sleep.

## 2016-08-03 ENCOUNTER — Ambulatory Visit: Payer: BLUE CROSS/BLUE SHIELD

## 2016-08-03 ENCOUNTER — Ambulatory Visit (HOSPITAL_BASED_OUTPATIENT_CLINIC_OR_DEPARTMENT_OTHER): Payer: BLUE CROSS/BLUE SHIELD

## 2016-08-03 VITALS — BP 100/58 | HR 85 | Temp 98.4°F | Resp 18

## 2016-08-03 DIAGNOSIS — Z5189 Encounter for other specified aftercare: Secondary | ICD-10-CM

## 2016-08-03 DIAGNOSIS — C187 Malignant neoplasm of sigmoid colon: Secondary | ICD-10-CM | POA: Diagnosis not present

## 2016-08-03 DIAGNOSIS — C182 Malignant neoplasm of ascending colon: Secondary | ICD-10-CM

## 2016-08-03 MED ORDER — HEPARIN SOD (PORK) LOCK FLUSH 100 UNIT/ML IV SOLN
500.0000 [IU] | Freq: Once | INTRAVENOUS | Status: AC | PRN
  Administered 2016-08-03: 500 [IU]
  Filled 2016-08-03: qty 5

## 2016-08-03 MED ORDER — PEGFILGRASTIM INJECTION 6 MG/0.6ML ~~LOC~~
6.0000 mg | PREFILLED_SYRINGE | Freq: Once | SUBCUTANEOUS | Status: AC
  Administered 2016-08-03: 6 mg via SUBCUTANEOUS
  Filled 2016-08-03: qty 0.6

## 2016-08-03 MED ORDER — SODIUM CHLORIDE 0.9% FLUSH
10.0000 mL | INTRAVENOUS | Status: DC | PRN
  Administered 2016-08-03: 10 mL
  Filled 2016-08-03: qty 10

## 2016-08-08 ENCOUNTER — Ambulatory Visit (HOSPITAL_BASED_OUTPATIENT_CLINIC_OR_DEPARTMENT_OTHER): Payer: BLUE CROSS/BLUE SHIELD

## 2016-08-08 DIAGNOSIS — C187 Malignant neoplasm of sigmoid colon: Secondary | ICD-10-CM

## 2016-08-08 DIAGNOSIS — Z452 Encounter for adjustment and management of vascular access device: Secondary | ICD-10-CM | POA: Diagnosis not present

## 2016-08-08 MED ORDER — SODIUM CHLORIDE 0.9% FLUSH
10.0000 mL | INTRAVENOUS | Status: DC | PRN
  Administered 2016-08-08: 10 mL via INTRAVENOUS
  Filled 2016-08-08: qty 10

## 2016-08-08 MED ORDER — HEPARIN SOD (PORK) LOCK FLUSH 100 UNIT/ML IV SOLN
500.0000 [IU] | Freq: Once | INTRAVENOUS | Status: AC | PRN
  Administered 2016-08-08: 250 [IU] via INTRAVENOUS
  Filled 2016-08-08: qty 5

## 2016-08-12 ENCOUNTER — Other Ambulatory Visit: Payer: Self-pay | Admitting: Oncology

## 2016-08-15 ENCOUNTER — Ambulatory Visit (HOSPITAL_BASED_OUTPATIENT_CLINIC_OR_DEPARTMENT_OTHER): Payer: BLUE CROSS/BLUE SHIELD | Admitting: Nurse Practitioner

## 2016-08-15 ENCOUNTER — Ambulatory Visit: Payer: BLUE CROSS/BLUE SHIELD

## 2016-08-15 ENCOUNTER — Ambulatory Visit (HOSPITAL_BASED_OUTPATIENT_CLINIC_OR_DEPARTMENT_OTHER): Payer: BLUE CROSS/BLUE SHIELD

## 2016-08-15 ENCOUNTER — Other Ambulatory Visit (HOSPITAL_BASED_OUTPATIENT_CLINIC_OR_DEPARTMENT_OTHER): Payer: BLUE CROSS/BLUE SHIELD

## 2016-08-15 DIAGNOSIS — Z5111 Encounter for antineoplastic chemotherapy: Secondary | ICD-10-CM | POA: Diagnosis not present

## 2016-08-15 DIAGNOSIS — D696 Thrombocytopenia, unspecified: Secondary | ICD-10-CM | POA: Diagnosis not present

## 2016-08-15 DIAGNOSIS — C187 Malignant neoplasm of sigmoid colon: Secondary | ICD-10-CM

## 2016-08-15 DIAGNOSIS — C182 Malignant neoplasm of ascending colon: Secondary | ICD-10-CM

## 2016-08-15 DIAGNOSIS — Z452 Encounter for adjustment and management of vascular access device: Secondary | ICD-10-CM

## 2016-08-15 LAB — COMPREHENSIVE METABOLIC PANEL
ALBUMIN: 3.2 g/dL — AB (ref 3.5–5.0)
ALT: 42 U/L (ref 0–55)
AST: 42 U/L — AB (ref 5–34)
Alkaline Phosphatase: 187 U/L — ABNORMAL HIGH (ref 40–150)
Anion Gap: 7 mEq/L (ref 3–11)
BUN: 9.3 mg/dL (ref 7.0–26.0)
CALCIUM: 8.7 mg/dL (ref 8.4–10.4)
CHLORIDE: 110 meq/L — AB (ref 98–109)
CO2: 25 mEq/L (ref 22–29)
CREATININE: 0.8 mg/dL (ref 0.6–1.1)
EGFR: 81 mL/min/{1.73_m2} — ABNORMAL LOW (ref >90)
GLUCOSE: 114 mg/dL (ref 70–140)
Potassium: 3.6 mEq/L (ref 3.5–5.1)
SODIUM: 142 meq/L (ref 136–145)
Total Bilirubin: 0.42 mg/dL (ref 0.20–1.20)
Total Protein: 6.8 g/dL (ref 6.4–8.3)

## 2016-08-15 LAB — CBC WITH DIFFERENTIAL/PLATELET
BASO%: 0.4 % (ref 0.0–2.0)
Basophils Absolute: 0 10*3/uL (ref 0.0–0.1)
EOS%: 5.3 % (ref 0.0–7.0)
Eosinophils Absolute: 0.3 10*3/uL (ref 0.0–0.5)
HEMATOCRIT: 32.4 % — AB (ref 34.8–46.6)
HEMOGLOBIN: 10.7 g/dL — AB (ref 11.6–15.9)
LYMPH#: 1 10*3/uL (ref 0.9–3.3)
LYMPH%: 19.2 % (ref 14.0–49.7)
MCH: 30.4 pg (ref 25.1–34.0)
MCHC: 33 g/dL (ref 31.5–36.0)
MCV: 92 fL (ref 79.5–101.0)
MONO#: 0.5 10*3/uL (ref 0.1–0.9)
MONO%: 9.5 % (ref 0.0–14.0)
NEUT#: 3.5 10*3/uL (ref 1.5–6.5)
NEUT%: 65.6 % (ref 38.4–76.8)
Platelets: 73 10*3/uL — ABNORMAL LOW (ref 145–400)
RBC: 3.52 10*6/uL — ABNORMAL LOW (ref 3.70–5.45)
RDW: 20 % — AB (ref 11.2–14.5)
WBC: 5.3 10*3/uL (ref 3.9–10.3)

## 2016-08-15 MED ORDER — SODIUM CHLORIDE 0.9 % IV SOLN
2400.0000 mg/m2 | INTRAVENOUS | Status: DC
  Administered 2016-08-15: 4750 mg via INTRAVENOUS
  Filled 2016-08-15: qty 95

## 2016-08-15 MED ORDER — OXALIPLATIN CHEMO INJECTION 100 MG/20ML
65.0000 mg/m2 | Freq: Once | INTRAVENOUS | Status: AC
  Administered 2016-08-15: 130 mg via INTRAVENOUS
  Filled 2016-08-15: qty 20

## 2016-08-15 MED ORDER — DEXAMETHASONE SODIUM PHOSPHATE 10 MG/ML IJ SOLN
10.0000 mg | Freq: Once | INTRAMUSCULAR | Status: AC
  Administered 2016-08-15: 10 mg via INTRAVENOUS

## 2016-08-15 MED ORDER — DEXTROSE 5 % IV SOLN
Freq: Once | INTRAVENOUS | Status: AC
  Administered 2016-08-15: 13:00:00 via INTRAVENOUS

## 2016-08-15 MED ORDER — PALONOSETRON HCL INJECTION 0.25 MG/5ML
0.2500 mg | Freq: Once | INTRAVENOUS | Status: AC
  Administered 2016-08-15: 0.25 mg via INTRAVENOUS

## 2016-08-15 MED ORDER — PALONOSETRON HCL INJECTION 0.25 MG/5ML
INTRAVENOUS | Status: AC
  Filled 2016-08-15: qty 5

## 2016-08-15 MED ORDER — SODIUM CHLORIDE 0.9% FLUSH
10.0000 mL | INTRAVENOUS | Status: DC | PRN
  Administered 2016-08-15: 10 mL via INTRAVENOUS
  Filled 2016-08-15: qty 10

## 2016-08-15 MED ORDER — DEXAMETHASONE SODIUM PHOSPHATE 10 MG/ML IJ SOLN
INTRAMUSCULAR | Status: AC
  Filled 2016-08-15: qty 1

## 2016-08-15 MED ORDER — FLUCONAZOLE 100 MG PO TABS: 100.0000 mg | ORAL_TABLET | Freq: Every day | ORAL | 0 refills | Status: DC

## 2016-08-15 MED ORDER — LEUCOVORIN CALCIUM INJECTION 350 MG
400.0000 mg/m2 | Freq: Once | INTRAVENOUS | Status: AC
  Administered 2016-08-15: 792 mg via INTRAVENOUS
  Filled 2016-08-15: qty 17.5

## 2016-08-15 NOTE — Progress Notes (Signed)
  [place] OFFICE PROGRESS NOTE   Diagnosis:  Colon cancer  INTERVAL HISTORY:   [name] returns as scheduled. She completed cycle 4 FOLFOX 08/01/2016. She denies nausea/vomiting. No mouth sores. She again developed thrush following treatment which resolved with Diflucan. No diarrhea. Minimal cold sensitivity. No persistent neuropathy symptoms. She denies any bleeding.  Objective:  Vital signs in last 24 hours:  Blood pressure (!) 122/59, pulse 85, temperature 98.3 F (36.8 C), temperature source Oral, resp. rate 18, weight 178 lb 12.8 oz (81.1 kg), SpO2 99 %.    HEENT: No thrush or ulcers. Resp: Lungs clear bilaterally. Cardio: Regular rate and rhythm. GI: Abdomen soft and nontender. No hepatomegaly. Vascular: No leg edema. Neuro: Vibratory sense intact over the fingertips per tuning fork exam.  Skin: No rash. Port-A-Cath without erythema.    Lab Results:  Lab Results  Component Value Date   WBC 5.3 08/15/2016   HGB 10.7 (L) 08/15/2016   HCT 32.4 (L) 08/15/2016   MCV 92.0 08/15/2016   PLT 73 (L) 08/15/2016   NEUTROABS 3.5 08/15/2016    Imaging:  No results found.  Medications: I have reviewed the patient's current medications.  Assessment/Plan: 1. 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 1/14 lymph nodes positive for metastatic carcinoma ? No loss of mismatch repair protein expression ? Cycle 1 adjuvant FOLFOX 06/13/2016 ? Cycle 2 adjuvant FOLFOX 07/03/2016 with Neulasta support ? Cycle 3 adjuvant FOLFOX 07/16/2016 with Neulasta support (oxaliplatin dose reduced to 65 mg/m due to thrombocytopenia) ? Cycle 4 adjuvant FOLFOX 08/01/2016 with Neulasta support ? Cycle 5 adjuvant FOLFOX 08/15/2016 with Neulasta support (5-FU bolus held due to thrombocytopenia)  2. H. pylori diagnosed on upper endoscopy 04/23/2016 3. Family history  of colon cancer 4. 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 Family history of "blood clots"-hypercoagulable panel submitted 06/13/2016 6. PICC placement 06/13/2016 7. Neutropenia secondary to chemotherapy 8. Thrombocytopenia secondary to chemotherapy    Disposition: Martha Robinson appears stable. She has completed 4 cycles of FOLFOX. Plan to proceed with cycle 5 today as scheduled.  She has persistent/progressive thrombocytopenia on labs today. I reviewed today's labs with Dr. Burr Medico. The 5-fluorouracil bolus will be held with today's treatment. Ms. Abeln understands to contact the office with any bleeding.  She will return for a follow-up visit and cycle 6 FOLFOX in 2 weeks. She will contact the office in the interim with any problems.  Plan reviewed with Dr. Burr Medico. 25 minutes were spent face-to-face at today's visit with the majority of that time involved in counseling/coordination of care.  Ned Card ANP/GNP-BC   08/15/2016  12:53 PM

## 2016-08-15 NOTE — Patient Instructions (Signed)
[facility] Discharge Instructions for Patients Receiving Chemotherapy  Today you received the following chemotherapy agents: Oxaliplatin, Leucovorin, Adrucil.   To help prevent nausea and vomiting after your treatment, we encourage you to take your nausea medication as directed.    If you develop nausea and vomiting that is not controlled by your nausea medication, call the clinic.   BELOW ARE SYMPTOMS THAT SHOULD BE REPORTED IMMEDIATELY:  *FEVER GREATER THAN 100.5 F  *CHILLS WITH OR WITHOUT FEVER  NAUSEA AND VOMITING THAT IS NOT CONTROLLED WITH YOUR NAUSEA MEDICATION  *UNUSUAL SHORTNESS OF BREATH  *UNUSUAL BRUISING OR BLEEDING  TENDERNESS IN MOUTH AND THROAT WITH OR WITHOUT PRESENCE OF ULCERS  *URINARY PROBLEMS  *BOWEL PROBLEMS  UNUSUAL RASH Items with * indicate a potential emergency and should be followed up as soon as possible.  Feel free to call the clinic you have any questions or concerns. The clinic phone number is [phone].  Please show the CHEMO ALERT CARD at check-in to the Emergency Department and triage nurse.   

## 2016-08-17 ENCOUNTER — Ambulatory Visit: Payer: BLUE CROSS/BLUE SHIELD

## 2016-08-17 ENCOUNTER — Ambulatory Visit (HOSPITAL_BASED_OUTPATIENT_CLINIC_OR_DEPARTMENT_OTHER): Payer: BLUE CROSS/BLUE SHIELD

## 2016-08-17 VITALS — BP 119/47 | HR 78 | Temp 98.4°F | Resp 18

## 2016-08-17 DIAGNOSIS — D6959 Other secondary thrombocytopenia: Secondary | ICD-10-CM | POA: Diagnosis not present

## 2016-08-17 DIAGNOSIS — C182 Malignant neoplasm of ascending colon: Secondary | ICD-10-CM

## 2016-08-17 DIAGNOSIS — C187 Malignant neoplasm of sigmoid colon: Secondary | ICD-10-CM | POA: Diagnosis not present

## 2016-08-17 MED ORDER — PEGFILGRASTIM INJECTION 6 MG/0.6ML ~~LOC~~
6.0000 mg | PREFILLED_SYRINGE | Freq: Once | SUBCUTANEOUS | Status: AC
  Administered 2016-08-17: 6 mg via SUBCUTANEOUS
  Filled 2016-08-17: qty 0.6

## 2016-08-17 MED ORDER — HEPARIN SOD (PORK) LOCK FLUSH 100 UNIT/ML IV SOLN
250.0000 [IU] | Freq: Once | INTRAVENOUS | Status: AC | PRN
  Administered 2016-08-17: 250 [IU]
  Filled 2016-08-17: qty 5

## 2016-08-17 MED ORDER — SODIUM CHLORIDE 0.9% FLUSH
10.0000 mL | INTRAVENOUS | Status: DC | PRN
  Administered 2016-08-17: 10 mL
  Filled 2016-08-17: qty 10

## 2016-08-17 NOTE — Patient Instructions (Signed)

## 2016-08-19 ENCOUNTER — Other Ambulatory Visit: Payer: Self-pay | Admitting: Nurse Practitioner

## 2016-08-19 DIAGNOSIS — C187 Malignant neoplasm of sigmoid colon: Secondary | ICD-10-CM

## 2016-08-22 ENCOUNTER — Ambulatory Visit (HOSPITAL_BASED_OUTPATIENT_CLINIC_OR_DEPARTMENT_OTHER): Payer: BLUE CROSS/BLUE SHIELD

## 2016-08-22 DIAGNOSIS — Z452 Encounter for adjustment and management of vascular access device: Secondary | ICD-10-CM | POA: Diagnosis not present

## 2016-08-22 DIAGNOSIS — C187 Malignant neoplasm of sigmoid colon: Secondary | ICD-10-CM

## 2016-08-22 MED ORDER — SODIUM CHLORIDE 0.9% FLUSH
10.0000 mL | INTRAVENOUS | Status: DC | PRN
  Administered 2016-08-22: 10 mL via INTRAVENOUS
  Filled 2016-08-22: qty 10

## 2016-08-22 MED ORDER — HEPARIN SOD (PORK) LOCK FLUSH 100 UNIT/ML IV SOLN
250.0000 [IU] | INTRAVENOUS | Status: DC | PRN
  Administered 2016-08-22: 15:00:00 via INTRAVENOUS
  Filled 2016-08-22: qty 5

## 2016-08-27 ENCOUNTER — Other Ambulatory Visit: Payer: Self-pay | Admitting: Oncology

## 2016-08-29 ENCOUNTER — Ambulatory Visit (HOSPITAL_BASED_OUTPATIENT_CLINIC_OR_DEPARTMENT_OTHER): Payer: BLUE CROSS/BLUE SHIELD | Admitting: Oncology

## 2016-08-29 ENCOUNTER — Other Ambulatory Visit: Payer: Self-pay | Admitting: Oncology

## 2016-08-29 ENCOUNTER — Ambulatory Visit (HOSPITAL_BASED_OUTPATIENT_CLINIC_OR_DEPARTMENT_OTHER): Payer: BLUE CROSS/BLUE SHIELD

## 2016-08-29 ENCOUNTER — Other Ambulatory Visit: Payer: Self-pay | Admitting: *Deleted

## 2016-08-29 ENCOUNTER — Ambulatory Visit: Payer: BLUE CROSS/BLUE SHIELD

## 2016-08-29 ENCOUNTER — Telehealth: Payer: Self-pay | Admitting: Oncology

## 2016-08-29 VITALS — BP 114/52 | HR 84 | Temp 98.4°F | Resp 18 | Ht 66.0 in | Wt 176.9 lb

## 2016-08-29 DIAGNOSIS — Z832 Family history of diseases of the blood and blood-forming organs and certain disorders involving the immune mechanism: Secondary | ICD-10-CM

## 2016-08-29 DIAGNOSIS — C187 Malignant neoplasm of sigmoid colon: Secondary | ICD-10-CM

## 2016-08-29 DIAGNOSIS — Z800 Family history of malignant neoplasm of digestive organs: Secondary | ICD-10-CM | POA: Diagnosis not present

## 2016-08-29 DIAGNOSIS — Z7901 Long term (current) use of anticoagulants: Secondary | ICD-10-CM

## 2016-08-29 DIAGNOSIS — I81 Portal vein thrombosis: Secondary | ICD-10-CM | POA: Diagnosis not present

## 2016-08-29 DIAGNOSIS — Z452 Encounter for adjustment and management of vascular access device: Secondary | ICD-10-CM

## 2016-08-29 DIAGNOSIS — D6959 Other secondary thrombocytopenia: Secondary | ICD-10-CM | POA: Diagnosis not present

## 2016-08-29 DIAGNOSIS — C182 Malignant neoplasm of ascending colon: Secondary | ICD-10-CM

## 2016-08-29 DIAGNOSIS — Z5111 Encounter for antineoplastic chemotherapy: Secondary | ICD-10-CM

## 2016-08-29 LAB — COMPREHENSIVE METABOLIC PANEL
ALBUMIN: 3.1 g/dL — AB (ref 3.5–5.0)
ALK PHOS: 208 U/L — AB (ref 40–150)
ALT: 27 U/L (ref 0–55)
AST: 29 U/L (ref 5–34)
Anion Gap: 10 mEq/L (ref 3–11)
BUN: 7.1 mg/dL (ref 7.0–26.0)
CALCIUM: 9.1 mg/dL (ref 8.4–10.4)
CO2: 23 mEq/L (ref 22–29)
Chloride: 109 mEq/L (ref 98–109)
Creatinine: 0.8 mg/dL (ref 0.6–1.1)
EGFR: 75 mL/min/{1.73_m2} — AB (ref >90)
Glucose: 199 mg/dl — ABNORMAL HIGH (ref 70–140)
POTASSIUM: 3.6 meq/L (ref 3.5–5.1)
Sodium: 142 mEq/L (ref 136–145)
Total Bilirubin: 0.54 mg/dL (ref 0.20–1.20)
Total Protein: 6.9 g/dL (ref 6.4–8.3)

## 2016-08-29 LAB — CBC WITH DIFFERENTIAL/PLATELET
BASO%: 0.2 % (ref 0.0–2.0)
BASOS ABS: 0 10*3/uL (ref 0.0–0.1)
EOS ABS: 0.3 10*3/uL (ref 0.0–0.5)
EOS%: 5.5 % (ref 0.0–7.0)
HEMATOCRIT: 33.7 % — AB (ref 34.8–46.6)
HGB: 11.1 g/dL — ABNORMAL LOW (ref 11.6–15.9)
LYMPH#: 1 10*3/uL (ref 0.9–3.3)
LYMPH%: 17.3 % (ref 14.0–49.7)
MCH: 30.9 pg (ref 25.1–34.0)
MCHC: 32.9 g/dL (ref 31.5–36.0)
MCV: 93.9 fL (ref 79.5–101.0)
MONO#: 0.5 10*3/uL (ref 0.1–0.9)
MONO%: 8.5 % (ref 0.0–14.0)
NEUT#: 3.9 10*3/uL (ref 1.5–6.5)
NEUT%: 68.5 % (ref 38.4–76.8)
Platelets: 67 10*3/uL — ABNORMAL LOW (ref 145–400)
RBC: 3.59 10*6/uL — ABNORMAL LOW (ref 3.70–5.45)
RDW: 20.6 % — ABNORMAL HIGH (ref 11.2–14.5)
WBC: 5.7 10*3/uL (ref 3.9–10.3)

## 2016-08-29 LAB — CEA (IN HOUSE-CHCC): CEA (CHCC-IN HOUSE): 2.48 ng/mL (ref 0.00–5.00)

## 2016-08-29 MED ORDER — FLUOROURACIL CHEMO INJECTION 2.5 GM/50ML
400.0000 mg/m2 | Freq: Once | INTRAVENOUS | Status: AC
  Administered 2016-08-29: 800 mg via INTRAVENOUS
  Filled 2016-08-29: qty 16

## 2016-08-29 MED ORDER — PALONOSETRON HCL INJECTION 0.25 MG/5ML
INTRAVENOUS | Status: AC
  Filled 2016-08-29: qty 5

## 2016-08-29 MED ORDER — DEXTROSE 5 % IV SOLN
Freq: Once | INTRAVENOUS | Status: AC
  Administered 2016-08-29: 11:00:00 via INTRAVENOUS

## 2016-08-29 MED ORDER — PALONOSETRON HCL INJECTION 0.25 MG/5ML
0.2500 mg | Freq: Once | INTRAVENOUS | Status: AC
  Administered 2016-08-29: 0.25 mg via INTRAVENOUS

## 2016-08-29 MED ORDER — SODIUM CHLORIDE 0.9% FLUSH
10.0000 mL | INTRAVENOUS | Status: DC | PRN
  Administered 2016-08-29: 10 mL via INTRAVENOUS
  Filled 2016-08-29: qty 10

## 2016-08-29 MED ORDER — SODIUM CHLORIDE 0.9 % IV SOLN
2400.0000 mg/m2 | INTRAVENOUS | Status: DC
  Administered 2016-08-29: 4750 mg via INTRAVENOUS
  Filled 2016-08-29: qty 95

## 2016-08-29 MED ORDER — DEXAMETHASONE SODIUM PHOSPHATE 10 MG/ML IJ SOLN
10.0000 mg | Freq: Once | INTRAMUSCULAR | Status: AC
  Administered 2016-08-29: 10 mg via INTRAVENOUS

## 2016-08-29 MED ORDER — LEUCOVORIN CALCIUM INJECTION 350 MG
400.0000 mg/m2 | Freq: Once | INTRAMUSCULAR | Status: AC
  Administered 2016-08-29: 792 mg via INTRAVENOUS
  Filled 2016-08-29: qty 39.6

## 2016-08-29 MED ORDER — DEXAMETHASONE SODIUM PHOSPHATE 10 MG/ML IJ SOLN
INTRAMUSCULAR | Status: AC
  Filled 2016-08-29: qty 1

## 2016-08-29 MED ORDER — DEXTROSE 5 % IV SOLN
50.0000 mg/m2 | Freq: Once | INTRAVENOUS | Status: AC
  Administered 2016-08-29: 100 mg via INTRAVENOUS
  Filled 2016-08-29: qty 20

## 2016-08-29 NOTE — Progress Notes (Signed)
  [facility] OFF[facility] PROGRESS NOTE   Diagnosis: Colon cancer  INTERVAL HISTORY:   Martha Robinson returns as scheduled. She completed another cycle of FOLFOX 08/15/2016. She tolerated the chemotherapy well. Cold sensitivity lasted one day following chemotherapy. No neuropathy symptoms at present. No bleeding. No abdominal pain. No mouth sores or diarrhea.  Objective:  Vital signs in last 24 hours:  Blood pressure (!) 114/52, pulse 84, temperature 98.4 F (36.9 C), temperature source Oral, resp. rate 18, height '5\' 6"'$  (1.676 m), weight 176 lb 14.4 oz (80.2 kg), SpO2 100 %.    HEENT: No thrush or ulcers Resp: Lungs clear bilaterally Cardio: Regular rate and rhythm GI: No hepatomegaly, no mass, nontender Vascular: No leg edema Neuro: Mild to moderate loss of vibratory sense at the fingertips bilaterally    Portacath/PICC-without erythema  Lab Results:  Lab Results  Component Value Date   WBC 5.7 08/29/2016   HGB 11.1 (L) 08/29/2016   HCT 33.7 (L) 08/29/2016   MCV 93.9 08/29/2016   PLT 67 (L) 08/29/2016   NEUTROABS 3.9 08/29/2016     Medications: I have reviewed the patient's current medications.  Assessment/Plan: 1. 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 1/14 lymph nodes positive for metastatic carcinoma ? No loss of mismatch repair protein expression ? Cycle 1 adjuvant FOLFOX 06/13/2016 ? Cycle 2 adjuvant FOLFOX 07/03/2016 with Neulasta support ? Cycle 3 adjuvant FOLFOX 07/16/2016 with Neulasta support (oxaliplatin dose reduced to 65 mg/m due to thrombocytopenia) ? Cycle 4 adjuvant FOLFOX 08/01/2016 with Neulasta support ? Cycle 5 adjuvant FOLFOX 08/15/2016 with Neulasta support (5-FU bolus held due to thrombocytopenia) ? Cycle 6 adjuvant FOLFOX 08/29/2016, oxaliplatin further dose reduced  2. H. pylori diagnosed on upper endoscopy  04/23/2016 3. 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 Family history of "blood clots"-hypercoagulable panel submitted 06/13/2016 6. PICC placement 06/13/2016 7. Neutropenia secondary to chemotherapy 8. Thrombocytopenia secondary to chemotherapy       Disposition:  Martha Robinson appears well. She will complete cycle 6 of adjuvant chemotherapy today. We discussed the risk/benefit of proceeding with chemotherapy given the thrombocytopenia and anticoagulation therapy. We dose reduce the oxaliplatin further. She would like to proceed. She will contact us for bleeding. She will return for a nadir CBC 09/07/2016. She will be scheduled for an office visit in one month. The PICC will be discontinued at the completion of chemotherapy 08/31/2016.  Martha Coder, MD  08/29/2016  9:00 AM

## 2016-08-29 NOTE — Telephone Encounter (Signed)
Appointments scheduled per 1/3 LOS. Patient given AVS report and calendars with future scheduled appointments. °

## 2016-08-29 NOTE — Patient Instructions (Signed)
[facility] Discharge Instructions for Patients Receiving Chemotherapy  Today you received the following chemotherapy agents: Oxaliplatin, Leucovorin, and Adrucil   To help prevent nausea and vomiting after your treatment, we encourage you to take your nausea medication as directed.    If you develop nausea and vomiting that is not controlled by your nausea medication, call the clinic.   BELOW ARE SYMPTOMS THAT SHOULD BE REPORTED IMMEDIATELY:  *FEVER GREATER THAN 100.5 F  *CHILLS WITH OR WITHOUT FEVER  NAUSEA AND VOMITING THAT IS NOT CONTROLLED WITH YOUR NAUSEA MEDICATION  *UNUSUAL SHORTNESS OF BREATH  *UNUSUAL BRUISING OR BLEEDING  TENDERNESS IN MOUTH AND THROAT WITH OR WITHOUT PRESENCE OF ULCERS  *URINARY PROBLEMS  *BOWEL PROBLEMS  UNUSUAL RASH Items with * indicate a potential emergency and should be followed up as soon as possible.  Feel free to call the clinic you have any questions or concerns. The clinic phone number is [phone].  Please show the CHEMO ALERT CARD at check-in to the Emergency Department and triage nurse.   

## 2016-08-29 NOTE — Progress Notes (Signed)
OK to treat with platelet count of 67 per Dr. Benay Spice.  Oxaliplatin dose will be reduced today per Dr. Benay Spice.  PICC line to be d/c'd on Friday with pump d/c per Dr. Benay Spice.

## 2016-08-31 ENCOUNTER — Ambulatory Visit (HOSPITAL_BASED_OUTPATIENT_CLINIC_OR_DEPARTMENT_OTHER): Payer: BLUE CROSS/BLUE SHIELD

## 2016-08-31 VITALS — BP 100/57 | HR 77 | Temp 99.0°F | Resp 18

## 2016-08-31 DIAGNOSIS — Z5189 Encounter for other specified aftercare: Secondary | ICD-10-CM

## 2016-08-31 DIAGNOSIS — Z452 Encounter for adjustment and management of vascular access device: Secondary | ICD-10-CM

## 2016-08-31 DIAGNOSIS — C187 Malignant neoplasm of sigmoid colon: Secondary | ICD-10-CM | POA: Diagnosis not present

## 2016-08-31 DIAGNOSIS — C182 Malignant neoplasm of ascending colon: Secondary | ICD-10-CM

## 2016-08-31 MED ORDER — HEPARIN SOD (PORK) LOCK FLUSH 100 UNIT/ML IV SOLN
500.0000 [IU] | Freq: Once | INTRAVENOUS | Status: DC | PRN
  Filled 2016-08-31: qty 5

## 2016-08-31 MED ORDER — SODIUM CHLORIDE 0.9% FLUSH
10.0000 mL | INTRAVENOUS | Status: DC | PRN
  Administered 2016-08-31: 10 mL
  Filled 2016-08-31: qty 10

## 2016-08-31 MED ORDER — PEGFILGRASTIM INJECTION 6 MG/0.6ML ~~LOC~~
6.0000 mg | PREFILLED_SYRINGE | Freq: Once | SUBCUTANEOUS | Status: AC
  Administered 2016-08-31: 6 mg via SUBCUTANEOUS
  Filled 2016-08-31: qty 0.6

## 2016-08-31 MED ORDER — SODIUM CHLORIDE 0.9% FLUSH
10.0000 mL | INTRAVENOUS | Status: DC | PRN
  Administered 2016-08-31: 10 mL via INTRAVENOUS
  Filled 2016-08-31: qty 10

## 2016-08-31 NOTE — Patient Instructions (Signed)
Pegfilgrastim injection What is this medicine? PEGFILGRASTIM (PEG fil gra stim) is a long-acting granulocyte colony-stimulating factor that stimulates the growth of neutrophils, a type of white blood cell important in the body's fight against infection. It is used to reduce the incidence of fever and infection in patients with certain types of cancer who are receiving chemotherapy that affects the bone marrow, and to increase survival after being exposed to high doses of radiation. This medicine may be used for other purposes; ask your health care provider or pharmacist if you have questions. COMMON BRAND NAME(S): Neulasta What should I tell my health care provider before I take this medicine? They need to know if you have any of these conditions: -kidney disease -latex allergy -ongoing radiation therapy -sickle cell disease -skin reactions to acrylic adhesives (On-Body Injector only) -an unusual or allergic reaction to pegfilgrastim, filgrastim, other medicines, foods, dyes, or preservatives -pregnant or trying to get pregnant -breast-feeding How should I use this medicine? This medicine is for injection under the skin. If you get this medicine at home, you will be taught how to prepare and give the pre-filled syringe or how to use the On-body Injector. Refer to the patient Instructions for Use for detailed instructions. Use exactly as directed. Take your medicine at regular intervals. Do not take your medicine more often than directed. It is important that you put your used needles and syringes in a special sharps container. Do not put them in a trash can. If you do not have a sharps container, call your pharmacist or healthcare provider to get one. Talk to your pediatrician regarding the use of this medicine in children. While this drug may be prescribed for selected conditions, precautions do apply. Overdosage: If you think you have taken too much of this medicine contact a poison control  center or emergency room at once. NOTE: This medicine is only for you. Do not share this medicine with others. What if I miss a dose? It is important not to miss your dose. Call your doctor or health care professional if you miss your dose. If you miss a dose due to an On-body Injector failure or leakage, a new dose should be administered as soon as possible using a single prefilled syringe for manual use. What may interact with this medicine? Interactions have not been studied. Give your health care provider a list of all the medicines, herbs, non-prescription drugs, or dietary supplements you use. Also tell them if you smoke, drink alcohol, or use illegal drugs. Some items may interact with your medicine. This list may not describe all possible interactions. Give your health care provider a list of all the medicines, herbs, non-prescription drugs, or dietary supplements you use. Also tell them if you smoke, drink alcohol, or use illegal drugs. Some items may interact with your medicine. What should I watch for while using this medicine? You may need blood work done while you are taking this medicine. If you are going to need a MRI, CT scan, or other procedure, tell your doctor that you are using this medicine (On-Body Injector only). What side effects may I notice from receiving this medicine? Side effects that you should report to your doctor or health care professional as soon as possible: -allergic reactions like skin rash, itching or hives, swelling of the face, lips, or tongue -dizziness -fever -pain, redness, or irritation at site where injected -pinpoint red spots on the skin -red or dark-brown urine -shortness of breath or breathing problems -stomach or   side pain, or pain at the shoulder -swelling -tiredness -trouble passing urine or change in the amount of urine Side effects that usually do not require medical attention (report to your doctor or health care professional if they  continue or are bothersome): -bone pain -muscle pain This list may not describe all possible side effects. Call your doctor for medical advice about side effects. You may report side effects to FDA at 1-800-FDA-1088. Where should I keep my medicine? Keep out of the reach of children. Store pre-filled syringes in a refrigerator between 2 and 8 degrees C (36 and 46 degrees F). Do not freeze. Keep in carton to protect from light. Throw away this medicine if it is left out of the refrigerator for more than 48 hours. Throw away any unused medicine after the expiration date. NOTE: This sheet is a summary. It may not cover all possible information. If you have questions about this medicine, talk to your doctor, pharmacist, or health care provider.  2017 Elsevier/Gold Standard (2014-09-02 14:30:14)  

## 2016-08-31 NOTE — Progress Notes (Signed)
1335: right PICC removed per MD order, pt tolerated procedure well, Vaseline gauze pressure placed, pt educated to not lift with right arm, and to keep arm still for 30 minutes, to keep dressing on for 24 hours and to to keep dressing dry (ex no shower for 24 hours) and to monitor for bleeding and s/s of infection. Pt verbalizes understanding.

## 2016-09-03 ENCOUNTER — Other Ambulatory Visit: Payer: Self-pay | Admitting: Oncology

## 2016-09-07 ENCOUNTER — Other Ambulatory Visit (HOSPITAL_BASED_OUTPATIENT_CLINIC_OR_DEPARTMENT_OTHER): Payer: BLUE CROSS/BLUE SHIELD

## 2016-09-07 ENCOUNTER — Telehealth: Payer: Self-pay | Admitting: *Deleted

## 2016-09-07 DIAGNOSIS — C187 Malignant neoplasm of sigmoid colon: Secondary | ICD-10-CM | POA: Diagnosis not present

## 2016-09-07 LAB — CBC WITH DIFFERENTIAL/PLATELET
BASO%: 0.7 % (ref 0.0–2.0)
BASOS ABS: 0 10*3/uL (ref 0.0–0.1)
EOS ABS: 0.2 10*3/uL (ref 0.0–0.5)
EOS%: 5.3 % (ref 0.0–7.0)
HEMATOCRIT: 30.6 % — AB (ref 34.8–46.6)
HEMOGLOBIN: 10.3 g/dL — AB (ref 11.6–15.9)
LYMPH#: 0.8 10*3/uL — AB (ref 0.9–3.3)
LYMPH%: 16.6 % (ref 14.0–49.7)
MCH: 31.9 pg (ref 25.1–34.0)
MCHC: 33.8 g/dL (ref 31.5–36.0)
MCV: 94.6 fL (ref 79.5–101.0)
MONO#: 0.7 10*3/uL (ref 0.1–0.9)
MONO%: 13.8 % (ref 0.0–14.0)
NEUT%: 63.6 % (ref 38.4–76.8)
NEUTROS ABS: 3 10*3/uL (ref 1.5–6.5)
Platelets: 65 10*3/uL — ABNORMAL LOW (ref 145–400)
RBC: 3.23 10*6/uL — ABNORMAL LOW (ref 3.70–5.45)
RDW: 22 % — AB (ref 11.2–14.5)
WBC: 4.7 10*3/uL (ref 3.9–10.3)

## 2016-09-07 NOTE — Telephone Encounter (Signed)
Called pt with lab result per Dr. Gearldine Shown note below. She voiced understanding. Next appt confirmed.

## 2016-09-07 NOTE — Telephone Encounter (Signed)
-----   Message from Martha Pier, MD sent at 09/07/2016  3:41 PM EST ----- Please call patient, platelets are stable, f/u as scheduled

## 2016-09-25 ENCOUNTER — Telehealth: Payer: Self-pay | Admitting: *Deleted

## 2016-09-25 NOTE — Telephone Encounter (Signed)
Called pt with appointment due to provider schedule change. She agreed to 10/01/16 @ 1115/1145. Message to schedulers.

## 2016-09-26 ENCOUNTER — Other Ambulatory Visit: Payer: BLUE CROSS/BLUE SHIELD

## 2016-09-26 ENCOUNTER — Ambulatory Visit: Payer: BLUE CROSS/BLUE SHIELD | Admitting: Nurse Practitioner

## 2016-10-01 ENCOUNTER — Other Ambulatory Visit (HOSPITAL_BASED_OUTPATIENT_CLINIC_OR_DEPARTMENT_OTHER): Payer: BLUE CROSS/BLUE SHIELD

## 2016-10-01 ENCOUNTER — Ambulatory Visit (HOSPITAL_BASED_OUTPATIENT_CLINIC_OR_DEPARTMENT_OTHER): Payer: BLUE CROSS/BLUE SHIELD | Admitting: Nurse Practitioner

## 2016-10-01 VITALS — BP 123/66 | HR 80 | Temp 98.0°F | Resp 18 | Ht 66.0 in | Wt 173.8 lb

## 2016-10-01 DIAGNOSIS — E876 Hypokalemia: Secondary | ICD-10-CM | POA: Diagnosis not present

## 2016-10-01 DIAGNOSIS — C187 Malignant neoplasm of sigmoid colon: Secondary | ICD-10-CM | POA: Diagnosis not present

## 2016-10-01 DIAGNOSIS — D6959 Other secondary thrombocytopenia: Secondary | ICD-10-CM | POA: Diagnosis not present

## 2016-10-01 DIAGNOSIS — D701 Agranulocytosis secondary to cancer chemotherapy: Secondary | ICD-10-CM

## 2016-10-01 DIAGNOSIS — Z832 Family history of diseases of the blood and blood-forming organs and certain disorders involving the immune mechanism: Secondary | ICD-10-CM

## 2016-10-01 DIAGNOSIS — Z800 Family history of malignant neoplasm of digestive organs: Secondary | ICD-10-CM

## 2016-10-01 DIAGNOSIS — I81 Portal vein thrombosis: Secondary | ICD-10-CM

## 2016-10-01 DIAGNOSIS — Z7901 Long term (current) use of anticoagulants: Secondary | ICD-10-CM

## 2016-10-01 LAB — COMPREHENSIVE METABOLIC PANEL
ALBUMIN: 3.5 g/dL (ref 3.5–5.0)
ALK PHOS: 173 U/L — AB (ref 40–150)
ALT: 18 U/L (ref 0–55)
ANION GAP: 8 meq/L (ref 3–11)
AST: 25 U/L (ref 5–34)
BILIRUBIN TOTAL: 0.6 mg/dL (ref 0.20–1.20)
BUN: 6.8 mg/dL — ABNORMAL LOW (ref 7.0–26.0)
CALCIUM: 9.2 mg/dL (ref 8.4–10.4)
CO2: 26 meq/L (ref 22–29)
CREATININE: 0.8 mg/dL (ref 0.6–1.1)
Chloride: 107 mEq/L (ref 98–109)
EGFR: 78 mL/min/{1.73_m2} — ABNORMAL LOW (ref >90)
Glucose: 137 mg/dl (ref 70–140)
Potassium: 3.2 mEq/L — ABNORMAL LOW (ref 3.5–5.1)
Sodium: 142 mEq/L (ref 136–145)
TOTAL PROTEIN: 7.4 g/dL (ref 6.4–8.3)

## 2016-10-01 LAB — CBC WITH DIFFERENTIAL/PLATELET
BASO%: 0.3 % (ref 0.0–2.0)
Basophils Absolute: 0 10*3/uL (ref 0.0–0.1)
EOS%: 5.6 % (ref 0.0–7.0)
Eosinophils Absolute: 0.2 10*3/uL (ref 0.0–0.5)
HEMATOCRIT: 36.5 % (ref 34.8–46.6)
HEMOGLOBIN: 12.1 g/dL (ref 11.6–15.9)
LYMPH#: 0.6 10*3/uL — AB (ref 0.9–3.3)
LYMPH%: 20 % (ref 14.0–49.7)
MCH: 32.6 pg (ref 25.1–34.0)
MCHC: 33.2 g/dL (ref 31.5–36.0)
MCV: 98.4 fL (ref 79.5–101.0)
MONO#: 0.4 10*3/uL (ref 0.1–0.9)
MONO%: 14.4 % — ABNORMAL HIGH (ref 0.0–14.0)
NEUT%: 59.7 % (ref 38.4–76.8)
NEUTROS ABS: 1.8 10*3/uL (ref 1.5–6.5)
PLATELETS: 112 10*3/uL — AB (ref 145–400)
RBC: 3.71 10*6/uL (ref 3.70–5.45)
RDW: 17.3 % — AB (ref 11.2–14.5)
WBC: 3.1 10*3/uL — AB (ref 3.9–10.3)

## 2016-10-01 NOTE — Progress Notes (Signed)
  [facility] OFFICE PROGRESS NOTE   Diagnosis:  Colon cancer  INTERVAL HISTORY:   Martha Robinson returns as scheduled. She completed cycle 6 adjuvant FOLFOX 08/29/2016. She feels well. No nausea or vomiting. She continues to note white patches on the tongue. No nausea or vomiting. No constipation or diarrhea. She intermittently feels chilled at bedtime. No associated fever. She reports a good appetite.  Objective:  Vital signs in last 24 hours:  Blood pressure 123/66, pulse 80, temperature 98 F (36.7 C), temperature source Oral, resp. rate 18, height '5\' 6"'$  (1.676 m), weight 173 lb 12.8 oz (78.8 kg), SpO2 98 %.    HEENT: Question geographic tongue. Lymphatics: No palpable cervical, supraclavicular, axillary or inguinal lymph nodes. Resp: Lungs clear bilaterally. Cardio: Regular rate and rhythm. GI: Abdomen soft and nontender. No hepatomegaly. No mass. Vascular: No leg edema.   Lab Results:  Lab Results  Component Value Date   WBC 3.1 (L) 10/01/2016   HGB 12.1 10/01/2016   HCT 36.5 10/01/2016   MCV 98.4 10/01/2016   PLT 112 (L) 10/01/2016   NEUTROABS 1.8 10/01/2016    Imaging:  No results found.  Medications: I have reviewed the patient's current medications.  Assessment/Plan: 1. 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 1/14 lymph nodes positive for metastatic carcinoma ? No loss of mismatch repair protein expression ? Cycle 1 adjuvant FOLFOX 06/13/2016 ? Cycle 2 adjuvant FOLFOX 07/03/2016 with Neulasta support ? Cycle 3 adjuvant FOLFOX 07/16/2016 with Neulasta support (oxaliplatin dose reduced to 65 mg/m due to thrombocytopenia) ? Cycle 4 adjuvant FOLFOX 08/01/2016 with Neulasta support ? Cycle 5 adjuvant FOLFOX 08/15/2016 with Neulasta support (5-FU bolus held due to thrombocytopenia) ? Cycle 6 adjuvant FOLFOX 08/29/2016, oxaliplatin  further dose reduced  2. H. pylori diagnosed on upper endoscopy 04/23/2016 3. 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 Family history of "blood clots"-hypercoagulable panel submitted 06/13/2016 6. PICC placement 06/13/2016 7. Neutropenia secondary to chemotherapy 8. Thrombocytopenia secondary to chemotherapy   Disposition:Martha Robinson appears stable. She has completed the course of adjuvant chemotherapy. Blood counts are recovering.  She has mild hypokalemia on labs today. She will take Kdur 20 meq daily for the next 5 days.  She is currently maintained on Xarelto for a portal vein/SMV thrombosis 05/23/2016. A 6 month course of anticoagulation is planned.  She will return for labs and a follow-up visit in 2 months. She understands to continue Xarelto until she returns for that visit. She will contact the office in the interim with any problems.  Plan reviewed with Dr. Benay Spice.    Ned Card ANP/GNP-BC   10/01/2016  11:56 AM

## 2016-10-06 ENCOUNTER — Telehealth: Payer: Self-pay | Admitting: Oncology

## 2016-10-06 NOTE — Telephone Encounter (Signed)
Appointments scheduled per 10/01/16 los. Appointment letter and schedule mailed per 10/01/16 los.

## 2016-11-29 ENCOUNTER — Ambulatory Visit (HOSPITAL_BASED_OUTPATIENT_CLINIC_OR_DEPARTMENT_OTHER): Payer: BLUE CROSS/BLUE SHIELD | Admitting: Oncology

## 2016-11-29 ENCOUNTER — Other Ambulatory Visit (HOSPITAL_BASED_OUTPATIENT_CLINIC_OR_DEPARTMENT_OTHER): Payer: BLUE CROSS/BLUE SHIELD

## 2016-11-29 ENCOUNTER — Telehealth: Payer: Self-pay | Admitting: Oncology

## 2016-11-29 ENCOUNTER — Ambulatory Visit: Payer: BLUE CROSS/BLUE SHIELD

## 2016-11-29 VITALS — BP 132/66 | HR 72 | Temp 98.6°F | Resp 18 | Ht 66.0 in | Wt 177.8 lb

## 2016-11-29 DIAGNOSIS — Z7901 Long term (current) use of anticoagulants: Secondary | ICD-10-CM

## 2016-11-29 DIAGNOSIS — C187 Malignant neoplasm of sigmoid colon: Secondary | ICD-10-CM

## 2016-11-29 DIAGNOSIS — Z831 Family history of other infectious and parasitic diseases: Secondary | ICD-10-CM | POA: Diagnosis not present

## 2016-11-29 DIAGNOSIS — Z800 Family history of malignant neoplasm of digestive organs: Secondary | ICD-10-CM | POA: Diagnosis not present

## 2016-11-29 DIAGNOSIS — I81 Portal vein thrombosis: Secondary | ICD-10-CM

## 2016-11-29 DIAGNOSIS — Z85038 Personal history of other malignant neoplasm of large intestine: Secondary | ICD-10-CM

## 2016-11-29 LAB — CBC WITH DIFFERENTIAL/PLATELET
BASO%: 0.6 % (ref 0.0–2.0)
BASOS ABS: 0 10*3/uL (ref 0.0–0.1)
EOS%: 3.4 % (ref 0.0–7.0)
Eosinophils Absolute: 0.1 10*3/uL (ref 0.0–0.5)
HEMATOCRIT: 37.8 % (ref 34.8–46.6)
HEMOGLOBIN: 13 g/dL (ref 11.6–15.9)
LYMPH#: 1 10*3/uL (ref 0.9–3.3)
LYMPH%: 25.3 % (ref 14.0–49.7)
MCH: 32.5 pg (ref 25.1–34.0)
MCHC: 34.3 g/dL (ref 31.5–36.0)
MCV: 94.7 fL (ref 79.5–101.0)
MONO#: 0.3 10*3/uL (ref 0.1–0.9)
MONO%: 8 % (ref 0.0–14.0)
NEUT#: 2.5 10*3/uL (ref 1.5–6.5)
NEUT%: 62.7 % (ref 38.4–76.8)
PLATELETS: 155 10*3/uL (ref 145–400)
RBC: 3.99 10*6/uL (ref 3.70–5.45)
RDW: 13.3 % (ref 11.2–14.5)
WBC: 4 10*3/uL (ref 3.9–10.3)

## 2016-11-29 LAB — BASIC METABOLIC PANEL
ANION GAP: 9 meq/L (ref 3–11)
BUN: 13.3 mg/dL (ref 7.0–26.0)
CHLORIDE: 110 meq/L — AB (ref 98–109)
CO2: 24 meq/L (ref 22–29)
Calcium: 9.7 mg/dL (ref 8.4–10.4)
Creatinine: 0.9 mg/dL (ref 0.6–1.1)
EGFR: 66 mL/min/{1.73_m2} — ABNORMAL LOW (ref >90)
GLUCOSE: 116 mg/dL (ref 70–140)
POTASSIUM: 3.8 meq/L (ref 3.5–5.1)
Sodium: 143 mEq/L (ref 136–145)

## 2016-11-29 LAB — CEA (IN HOUSE-CHCC): CEA (CHCC-In House): 1.52 ng/mL (ref 0.00–5.00)

## 2016-11-29 NOTE — Progress Notes (Signed)
  [facility] OFFICE PROGRESS NOTE   Diagnosis: Colon cancer  INTERVAL HISTORY:   Martha Robinson returns as scheduled. She feels well. No complaint. She continues Xarelto. No bleeding.  Objective:  Vital signs in last 24 hours:  Blood pressure 132/66, pulse 72, temperature 98.6 F (37 C), temperature source Oral, resp. rate 18, height '5\' 6"'$  (1.676 m), weight 177 lb 12.8 oz (80.6 kg), SpO2 99 %.    HEENT: Neck without mass Lymphatics: No cervical, supraclavicular, axillary, or inguinal nodes Resp: Lungs clear bilaterally Cardio: Regular rate and rhythm GI: No hepatosplenomegaly, no mass, nontender Vascular: No leg edema   Lab Results:  Lab Results  Component Value Date   WBC 4.0 11/29/2016   HGB 13.0 11/29/2016   HCT 37.8 11/29/2016   MCV 94.7 11/29/2016   PLT 155 11/29/2016   NEUTROABS 2.5 11/29/2016     Medications: I have reviewed the patient's current medications.  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, status post a laparoscopic hand assisted sigmoid colectomy 05/04/2016, stage IIIa (T2 N1)  1/14 lymph nodes positive for metastatic carcinoma  No loss of mismatch repair protein expression  Cycle 1 adjuvant FOLFOX 06/13/2016  Cycle 2 adjuvant FOLFOX 07/03/2016 with Neulasta support  Cycle 3 adjuvant FOLFOX 07/16/2016 with Neulasta support (oxaliplatin dose reduced to 65 mg/m due to thrombocytopenia)  Cycle 4 adjuvant FOLFOX 08/01/2016 with Neulasta support  Cycle 5 adjuvant FOLFOX 08/15/2016 with Neulasta support (5-FU bolus held due to thrombocytopenia)  Cycle 6 adjuvant FOLFOX 08/29/2016, oxaliplatin further dose reduced  2. H. pylori diagnosed on upper endoscopy 04/23/2016 3. 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 Family history of "blood clots"- 06/13/2017  Positive lupus anticoagulant with negative anti-Cardiolipin and beta-2 glycoprotein  antibody levels 06/14/2016, remainder of hypercoagulation panel negative   Disposition:  Martha Robinson remains in clinical remission from colon cancer. We will follow-up on the CEA from today. She will be scheduled for an office visit, CEA, and restaging CT scans in September.  She has been maintained on Xarelto anticoagulation since being diagnosed with portal vein/SMV thrombosis in September 2017. The thrombosis was most likely related to the sigmoid colectomy a few weeks prior. She has no other history of venous thrombosis. I suspect the lupus anticoagulant is a benign finding.  I am hesitant to discontinue anticoagulation given the location and extent of the thrombosis. We repeated a lupus anticoagulant panel today. She will continue Xarelto anticoagulation.  She should schedule a surveillance colonoscopy for late summer or fall of 2018.  25 minutes were spent with the patient today. The majority of the time was used for counseling and coordination of care.  Betsy Coder, MD  11/29/2016  10:27 AM

## 2016-11-29 NOTE — Telephone Encounter (Signed)
Gave patient AVS and calender per 11/29/2016. [facility] to contact patient with CT schedule.

## 2016-11-30 ENCOUNTER — Telehealth: Payer: Self-pay | Admitting: *Deleted

## 2016-11-30 ENCOUNTER — Other Ambulatory Visit: Payer: Self-pay | Admitting: *Deleted

## 2016-11-30 MED ORDER — RIVAROXABAN 20 MG PO TABS: ORAL_TABLET | ORAL | 2 refills | Status: DC

## 2016-11-30 NOTE — Telephone Encounter (Signed)
-----   Message from Ladell Pier, MD sent at 11/30/2016  7:13 AM EDT ----- Please call patient, cea is normal

## 2016-11-30 NOTE — Telephone Encounter (Signed)
Call placed to patient to notify her per order of Dr. Benay Spice that cea is normal.  Patient appreciative of call and has no questions at this time.

## 2016-12-01 LAB — BETA-2-GLYCOPROTEIN I ABS, IGG/M/A: Beta-2 Glyco 1 IgA: <9 GPI IgA units (ref 0–25)

## 2016-12-01 LAB — CARDIOLIPIN ANTIBODIES, IGG, IGM, IGA
ANTICARDIOLIPIN IGG: 19 GPL U/mL — AB (ref 0–14)
Anticardiolipin Ab,IgA,Qn: <9 APL U/mL (ref 0–11)

## 2016-12-02 LAB — LUPUS ANTICOAGULANT PANEL
DRVVT CONFIRM: 1.3 ratio — AB (ref 0.8–1.2)
DRVVT: 62.7 s — AB (ref 0.0–47.0)
PTT-LA: 32.7 s (ref 0.0–51.9)
dRVVT Mix: 48.9 s — ABNORMAL HIGH (ref 0.0–47.0)

## 2017-02-20 ENCOUNTER — Other Ambulatory Visit: Payer: Self-pay | Admitting: *Deleted

## 2017-02-20 MED ORDER — RIVAROXABAN 20 MG PO TABS: ORAL_TABLET | ORAL | 2 refills | Status: DC

## 2017-05-06 ENCOUNTER — Ambulatory Visit (HOSPITAL_COMMUNITY)
Admission: RE | Admit: 2017-05-06 | Discharge: 2017-05-06 | Disposition: A | Discharge status: Home / Self Care | Payer: BLUE CROSS/BLUE SHIELD | Source: Ambulatory Visit | Attending: Oncology | Admitting: Oncology

## 2017-05-06 ENCOUNTER — Other Ambulatory Visit (HOSPITAL_BASED_OUTPATIENT_CLINIC_OR_DEPARTMENT_OTHER): Payer: BLUE CROSS/BLUE SHIELD

## 2017-05-06 DIAGNOSIS — I81 Portal vein thrombosis: Secondary | ICD-10-CM | POA: Diagnosis not present

## 2017-05-06 DIAGNOSIS — C187 Malignant neoplasm of sigmoid colon: Secondary | ICD-10-CM

## 2017-05-06 DIAGNOSIS — R911 Solitary pulmonary nodule: Secondary | ICD-10-CM | POA: Diagnosis not present

## 2017-05-06 DIAGNOSIS — Z85038 Personal history of other malignant neoplasm of large intestine: Secondary | ICD-10-CM

## 2017-05-06 LAB — BASIC METABOLIC PANEL
ANION GAP: 11 meq/L (ref 3–11)
BUN: 13 mg/dL (ref 7.0–26.0)
CO2: 25 meq/L (ref 22–29)
Calcium: 9.8 mg/dL (ref 8.4–10.4)
Chloride: 106 mEq/L (ref 98–109)
Creatinine: 0.9 mg/dL (ref 0.6–1.1)
EGFR: 65 mL/min/{1.73_m2} — ABNORMAL LOW (ref >90)
Glucose: 122 mg/dl (ref 70–140)
POTASSIUM: 4.2 meq/L (ref 3.5–5.1)
Sodium: 142 mEq/L (ref 136–145)

## 2017-05-06 LAB — CEA (IN HOUSE-CHCC): CEA (CHCC-In House): 1.2 ng/mL (ref 0.00–5.00)

## 2017-05-06 MED ORDER — IOPAMIDOL (ISOVUE-300) INJECTION 61%
INTRAVENOUS | Status: AC
Start: 2017-05-06 — End: 2017-05-06
  Filled 2017-05-06: qty 100

## 2017-05-06 MED ORDER — IOPAMIDOL (ISOVUE-300) INJECTION 61%
100.0000 mL | Freq: Once | INTRAVENOUS | Status: AC | PRN
  Administered 2017-05-06: 100 mL via INTRAVENOUS

## 2017-05-08 ENCOUNTER — Ambulatory Visit (HOSPITAL_BASED_OUTPATIENT_CLINIC_OR_DEPARTMENT_OTHER): Payer: BLUE CROSS/BLUE SHIELD | Admitting: Nurse Practitioner

## 2017-05-08 ENCOUNTER — Telehealth: Payer: Self-pay | Admitting: Oncology

## 2017-05-08 ENCOUNTER — Ambulatory Visit (HOSPITAL_BASED_OUTPATIENT_CLINIC_OR_DEPARTMENT_OTHER): Payer: BLUE CROSS/BLUE SHIELD

## 2017-05-08 VITALS — BP 115/54 | HR 67 | Temp 97.7°F | Resp 20 | Ht 66.0 in | Wt 180.1 lb

## 2017-05-08 DIAGNOSIS — Z7901 Long term (current) use of anticoagulants: Secondary | ICD-10-CM

## 2017-05-08 DIAGNOSIS — R350 Frequency of micturition: Secondary | ICD-10-CM

## 2017-05-08 DIAGNOSIS — C189 Malignant neoplasm of colon, unspecified: Secondary | ICD-10-CM

## 2017-05-08 DIAGNOSIS — R8290 Unspecified abnormal findings in urine: Secondary | ICD-10-CM | POA: Diagnosis not present

## 2017-05-08 DIAGNOSIS — I81 Portal vein thrombosis: Secondary | ICD-10-CM

## 2017-05-08 DIAGNOSIS — Z800 Family history of malignant neoplasm of digestive organs: Secondary | ICD-10-CM

## 2017-05-08 DIAGNOSIS — N939 Abnormal uterine and vaginal bleeding, unspecified: Secondary | ICD-10-CM | POA: Diagnosis not present

## 2017-05-08 DIAGNOSIS — Z85038 Personal history of other malignant neoplasm of large intestine: Secondary | ICD-10-CM

## 2017-05-08 LAB — URINALYSIS, MICROSCOPIC - CHCC
BILIRUBIN (URINE): NEGATIVE
BLOOD: NEGATIVE
Glucose: NEGATIVE mg/dL
KETONES: NEGATIVE mg/dL
NITRITE: NEGATIVE
Protein: NEGATIVE mg/dL
RBC / HPF: NEGATIVE (ref 0–2)
Specific Gravity, Urine: 1.01 (ref 1.003–1.035)
Urobilinogen, UR: 0.2 mg/dL (ref 0.2–1)
pH: 6 (ref 4.6–8.0)

## 2017-05-08 NOTE — Telephone Encounter (Signed)
Gave patient avs report and appointments for March 2019.  °

## 2017-05-08 NOTE — Progress Notes (Addendum)
[facility] OFFICE PROGRESS NOTE   Diagnosis:  Colon cancer  INTERVAL HISTORY:   Ms. Grudzinski returns as scheduled. No change in bowel habits. No rectal bleeding. She had a recent episode of vaginal bleeding lasting for 2 days. She has noted urinary frequency, malodorous urine and low abdominal discomfort for the past several weeks. She wonders if she has a urinary tract infection. She denies fever.  Objective:  Vital signs in last 24 hours:  Blood pressure (!) 115/54, pulse 67, temperature 97.7 F (36.5 C), temperature source Oral, resp. rate 20, height _0  (1.676 m), weight 180 lb 1.6 oz (81.7 kg), SpO2 99 %.    HEENT: Neck without mass. Lymphatics: No palpable cervical, supraclavicular, axillary or inguinal lymph nodes. Resp: Lungs clear bilaterally. Cardio: Regular rate and rhythm. GI: Abdomen soft and nontender. No hepatosplenomegaly. Vascular: No leg edema.    Lab Results:  Lab Results  Component Value Date   WBC 4.0 11/29/2016   HGB 13.0 11/29/2016   HCT 37.8 11/29/2016   MCV 94.7 11/29/2016   PLT 155 11/29/2016   NEUTROABS 2.5 11/29/2016    Imaging:  Ct Chest W Contrast  Result Date: 05/06/2017 CLINICAL DATA:  Malignant sigmoid colon cancer. Colectomy September 2017. Chemotherapy completed January 2018. Restaging/ surveillance. EXAM: CT CHEST, ABDOMEN, AND PELVIS WITH CONTRAST TECHNIQUE: Multidetector CT imaging of the chest, abdomen and pelvis was performed following the standard protocol during bolus administration of intravenous contrast. CONTRAST:  172m ISOVUE-300 IOPAMIDOL (ISOVUE-300) INJECTION 61% COMPARISON:  Multiple exams, including 06/17/2016 FINDINGS: CT CHEST FINDINGS Cardiovascular: Atherosclerotic calcification of the aortic arch. Mediastinum/Nodes: Right upper paratracheal node 0.7 cm in short axis on image 8/2. The no pathologically enlarged thoracic adenopathy. Small type 1 hiatal hernia. Lungs/Pleura: 4 by 3 mm subpleural nodule  in the left lower lobe on image 83/4, no change since the earliest available comparison of 05/02/2016. Densely calcified 0.7 by 0.6 cm left lower lobe nodule on image 74/4, most compatible with benign calcified granuloma. Musculoskeletal: Thoracic spondylosis. CT ABDOMEN PELVIS FINDINGS Hepatobiliary: Contracted gallbladder. No biliary dilatation or focal parenchymal liver lesion. Pancreas: Unremarkable Spleen: Several calcified granulomas.  Otherwise unremarkable. Adrenals/Urinary Tract: Unremarkable Stomach/Bowel: There are a few sigmoid colon diverticula. No abnormality of the anastomotic staple line in the sigmoid colon. Vascular/Lymphatic: Portal vein thrombosis with cavernous transformation and collateral vessels. The splenic vein is reconstituted from these collateral vessels, as is the SMV. Aortoiliac atherosclerotic vascular disease. There are few scattered mesenteric lymph nodes but these are not pathologically enlarged. Reproductive: Unremarkable Other: No supplemental non-categorized findings. Musculoskeletal: Unremarkable IMPRESSION: 1. No findings of recurrent malignancy. 2. 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 3. 4 by 3 mm subpleural nodule in the left lower lobe has been stable since the earliest available comparison of 05/02/2016, 12 months ago. Electronically Signed   By: WVan ClinesM.D.   On: 05/06/2017 11:42   Ct Abdomen Pelvis W Contrast  Result Date: 05/06/2017 CLINICAL DATA:  Malignant sigmoid colon cancer. Colectomy September 2017. Chemotherapy completed January 2018. Restaging/ surveillance. EXAM: CT CHEST, ABDOMEN, AND PELVIS WITH CONTRAST TECHNIQUE: Multidetector CT imaging of the chest, abdomen and pelvis was performed following the standard protocol during bolus administration of intravenous contrast. CONTRAST:  1021mISOVUE-300 IOPAMIDOL (ISOVUE-300) INJECTION 61% COMPARISON:  Multiple exams, including 06/17/2016 FINDINGS: CT  CHEST FINDINGS Cardiovascular: Atherosclerotic calcification of the aortic arch. Mediastinum/Nodes: Right upper paratracheal node 0.7 cm in short axis on image 8/2. The no pathologically enlarged thoracic  adenopathy. Small type 1 hiatal hernia. Lungs/Pleura: 4 by 3 mm subpleural nodule in the left lower lobe on image 83/4, no change since the earliest available comparison of 05/02/2016. Densely calcified 0.7 by 0.6 cm left lower lobe nodule on image 74/4, most compatible with benign calcified granuloma. Musculoskeletal: Thoracic spondylosis. CT ABDOMEN PELVIS FINDINGS Hepatobiliary: Contracted gallbladder. No biliary dilatation or focal parenchymal liver lesion. Pancreas: Unremarkable Spleen: Several calcified granulomas.  Otherwise unremarkable. Adrenals/Urinary Tract: Unremarkable Stomach/Bowel: There are a few sigmoid colon diverticula. No abnormality of the anastomotic staple line in the sigmoid colon. Vascular/Lymphatic: Portal vein thrombosis with cavernous transformation and collateral vessels. The splenic vein is reconstituted from these collateral vessels, as is the SMV. Aortoiliac atherosclerotic vascular disease. There are few scattered mesenteric lymph nodes but these are not pathologically enlarged. Reproductive: Unremarkable Other: No supplemental non-categorized findings. Musculoskeletal: Unremarkable IMPRESSION: 1. No findings of recurrent malignancy. 2. 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 3. 4 by 3 mm subpleural nodule in the left lower lobe has been stable since the earliest available comparison of 05/02/2016, 12 months ago. Electronically Signed   By: Van Clines M.D.   On: 05/06/2017 11:42    Medications: I have reviewed the patient's current medications.  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, status post a laparoscopic hand assisted sigmoid colectomy 05/04/2016, stage IIIa (T2 N1)  1/14  lymph nodes positive for metastatic carcinoma  No loss of mismatch repair protein expression  Cycle 1 adjuvant FOLFOX 06/13/2016  Cycle 2 adjuvant FOLFOX 07/03/2016 with Neulasta support  Cycle 3 adjuvant FOLFOX 07/16/2016 with Neulasta support (oxaliplatin dose reduced to 65 mg/m due to thrombocytopenia)  Cycle 4 adjuvant FOLFOX 08/01/2016 with Neulasta support  Cycle 5 adjuvant FOLFOX 08/15/2016 with Neulasta support (5-FU bolus held due to thrombocytopenia)  Cycle 6 adjuvant FOLFOX 08/29/2016, oxaliplatin further dose reduced  CTs 05/06/2017-no recurrent/metastatic disease. 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 Stable subpleural nodule left lower lobe.  2. H. pylori diagnosed on upper endoscopy 04/23/2016 3. 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 Family history of "blood clots"- 06/13/2017  06/13/2016 Positive lupus anticoagulant with negative anti-Cardiolipin and beta-2 glycoprotein antibody levels 06/14/2016, remainder of hypercoagulation panel negative  11/29/2016-positive lupus anticoagulant with negative beta-2 glycoprotein antibody levels, mildly elevated IgG anticardiolipin antibody   Disposition: Ms. Mallinger appears stable. CTs show no evidence of recurrent/metastatic disease. She is scheduled for a colonoscopy tomorrow.   She has been off of Xarelto for approximately 36 hours due to the upcoming colonoscopy. She will return to the lab today for  repeat lupus anticoagulant and anticardiolipin antibody levels. If negative/normal consideration will be given to discontinuing Xarelto.  We will obtain a urinalysis/culture to evaluate the urinary complaints.  She will follow-up with her PCP or gynecologist regarding the episode of vaginal bleeding.  We scheduled a return visit and CEA in 6 months. She will contact the office in the interim with any problems.  Patient seen with  Dr. Benay Spice. 25 minutes were spent face-to-face at today's visit with the majority of that time involved in counseling/coordination of care.    Ned Card ANP/GNP-BC   05/08/2017  9:44 AM  This was a shared visit with Ned Card. Ms. Mentzel remains in clinical remission from colon cancer. She has been maintained on Xarelto anticoagulation since developing a portal/superior mesenteric vein thrombosis in the postoperative setting. The CT 05/06/2017 confirmed chronic portal vein thrombosis with cavernous transformation  and collateral vessels.  We will repeat a lupus anticoagulant panel today. If negative, I will recommend discontinuing anticoagulation therapy.  She will return for an office visit and CEA in 6 months.  Julieanne Manson, M.D.

## 2017-05-09 LAB — URINE CULTURE

## 2017-05-10 LAB — CARDIOLIPIN ANTIBODIES, IGG, IGM, IGA: Anticardiolipin Ab,IgA,Qn: <9 APL U/mL (ref 0–11)

## 2017-05-10 LAB — LUPUS ANTICOAGULANT PANEL
PTT-LA: 31.8 s (ref 0.0–51.9)
dRVVT Mix: 43.1 s (ref 0.0–47.0)
dRVVT: 51.6 s — ABNORMAL HIGH (ref 0.0–47.0)

## 2017-05-13 ENCOUNTER — Telehealth: Payer: Self-pay | Admitting: *Deleted

## 2017-05-13 NOTE — Telephone Encounter (Signed)
Left message for pt to call office. Labs reviewed by Dr. Benay Spice: Stop Xarelto. Note faxed to pharmacy Do Not Refill Xarelto.

## 2017-05-13 NOTE — Telephone Encounter (Signed)
Pt called back. Message given for pt to stop her xarelto. Pt also told she does not have a UTI per UC done 9/12.

## 2017-10-31 IMAGING — XA IR US GUIDE VASC ACCESS RIGHT
1 series · 1 of 1 positions shown · IV contrast (agent unspecified)
Comparison: none

INDICATION: Patient with a history of sigmoid colon cancer who needs venous
access for chemotherapy.

EXAM:
ULTRASOUND AND FLUOROSCOPIC GUIDED PICC LINE INSERTION
MEDICATIONS:
1% lidocaine.
CONTRAST:  None
FLUOROSCOPY TIME:  Forty a seconds (8 mGy)
COMPLICATIONS:
None immediate.
TECHNIQUE: The procedure, risks, benefits, and alternatives were explained to
the patient and informed written consent was obtained. A timeout was
performed prior to the initiation of the procedure.

[Series 300: ir fluoro guide cv line left · 1 of 1 slices shown]
[im 1/1]
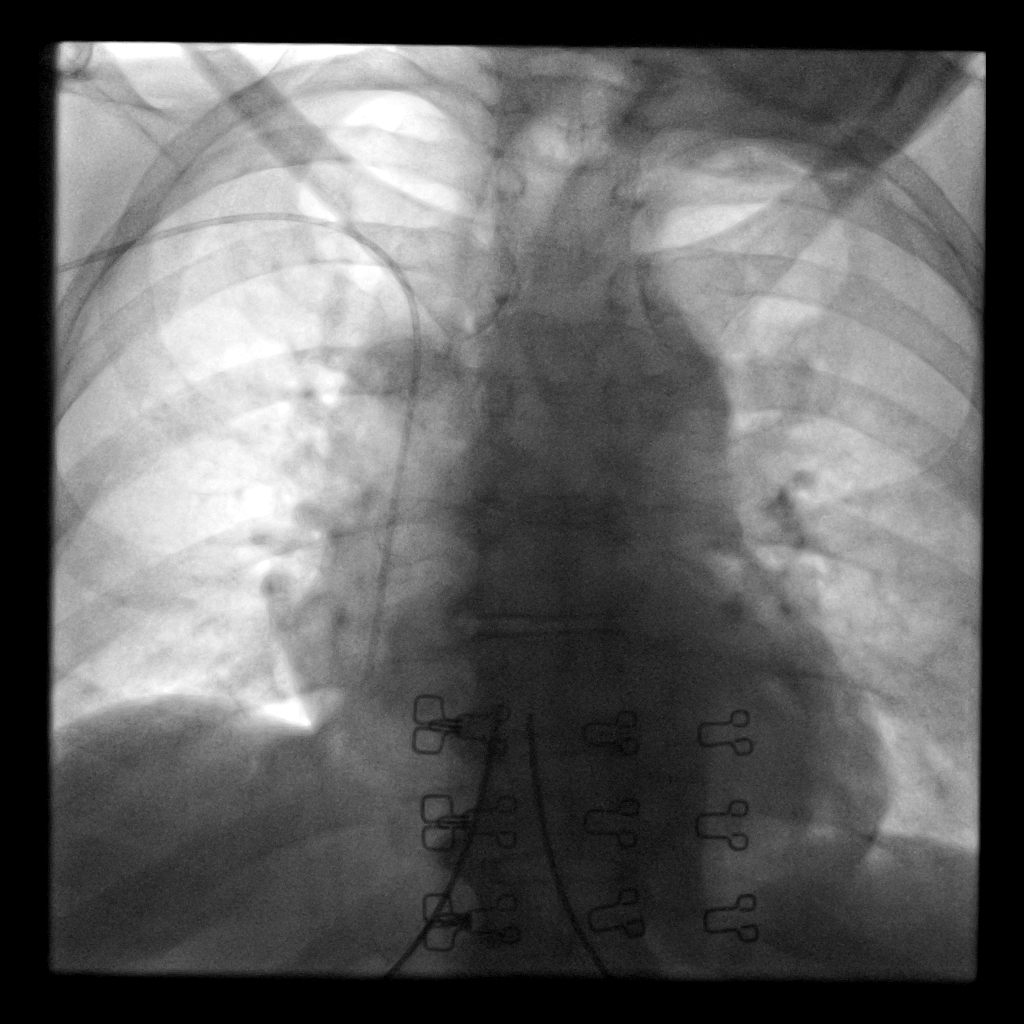

[1 of 1 positions shown; findings below may reference images not displayed]

The right upper extremity was prepped with chlorhexidine in a
sterile fashion, and a sterile drape was applied covering the
operative field. Maximum barrier sterile technique with sterile
gowns and gloves were used for the procedure. A timeout was
performed prior to the initiation of the procedure. Local anesthesia
was provided with 1% lidocaine.

Under direct ultrasound guidance, the right basilic vein was
accessed with a micropuncture kit after the overlying soft tissues
were anesthetized with 1% lidocaine. An ultrasound image was saved
for documentation purposes. A guidewire was advanced to the level of
the superior caval-atrial junction for measurement purposes and the
PICC line was cut to length. A peel-away sheath was placed and a 38
cm, 5 French, single lumen was inserted to level of the superior
caval-atrial junction. A post procedure spot fluoroscopic was
obtained. The catheter easily aspirated and flushed and was sutured
in place. A dressing was placed. The patient tolerated the procedure
well without immediate post procedural complication.
FINDINGS: After catheter placement, the tip lies within the superior
cavoatrial junction. The catheter aspirates and flushes normally and
is ready for immediate use.
IMPRESSION: Successful ultrasound and fluoroscopic guided placement of a right
basilic vein approach, 38 cm, 5 French, single lumen PICC with tip
at the superior caval-atrial junction. The PICC line is ready for
immediate use.

## 2017-11-04 IMAGING — CT CT ABD-PELV W/ CM
2 of 5 series · 15 of 46 positions shown, 17 images · IV contrast (ISOVUE)
Comparison: CT dated 05/23/2016

CLINICAL DATA: 60-year-old female with lower abdominal pain.
History of colon cancer.

EXAM:
CT ABDOMEN AND PELVIS WITH CONTRAST
TECHNIQUE: Multidetector CT imaging of the abdomen and pelvis was performed
using the standard protocol following bolus administration of
intravenous contrast.
CONTRAST:  100mL [id number] IOPAMIDOL ([id number]) INJECTION 61%

[Series 2: abd/pel with · axial · 0.78mm/px · z∈[-474,-74]mm · 12 of 94 slices shown, 14 images]
[im 7/94  soft-tissue]
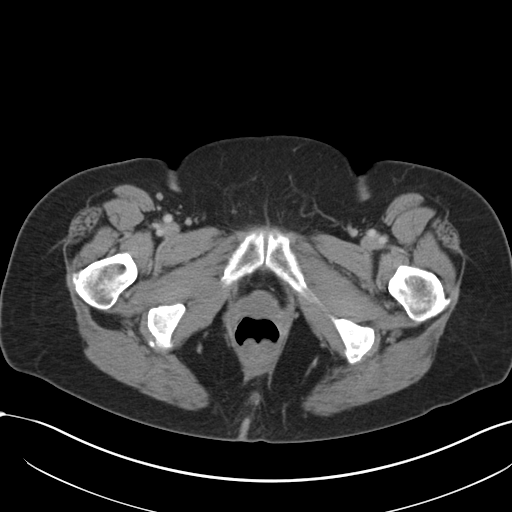
[im 7/94  bone]
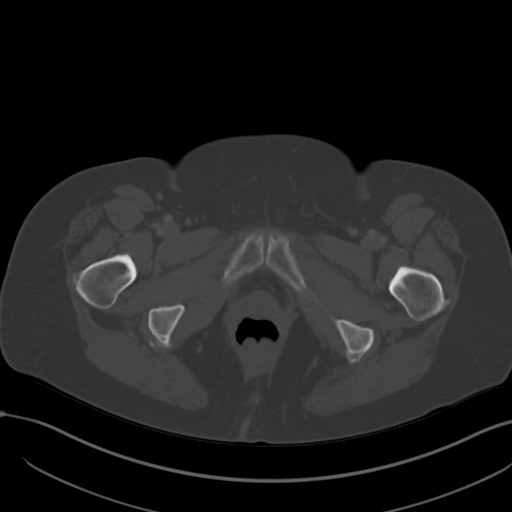
[im 14/94  soft-tissue]
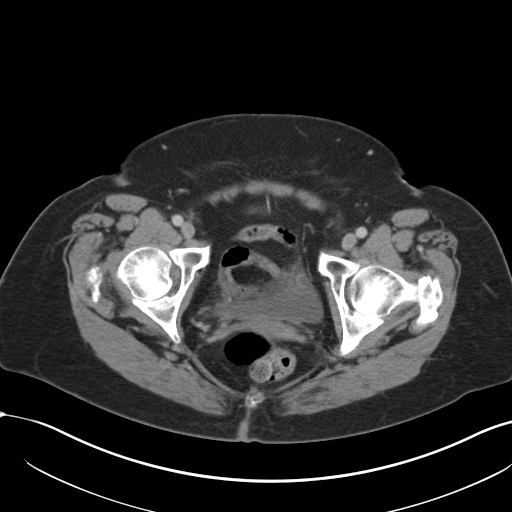
[im 20/94  soft-tissue]
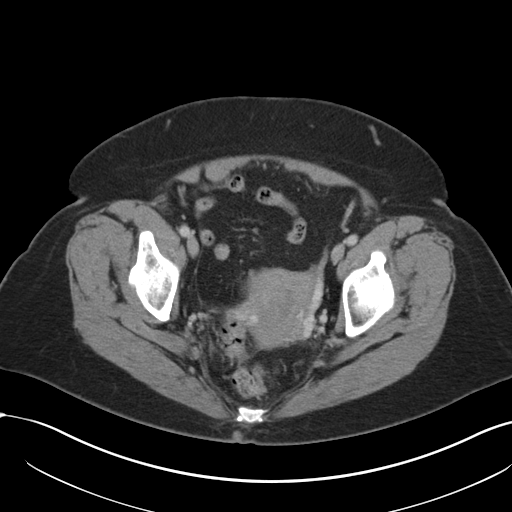
[im 27/94  soft-tissue]
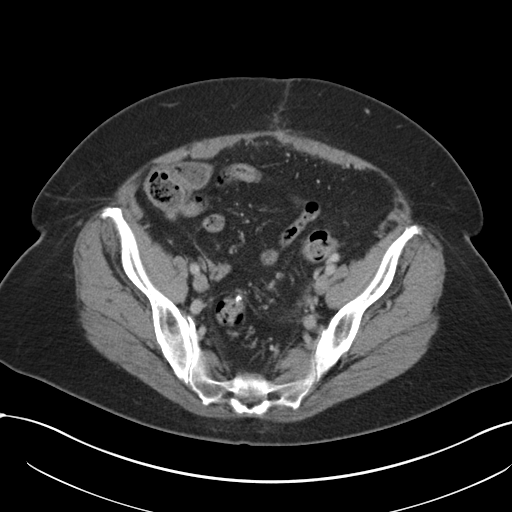
[im 34/94  soft-tissue]
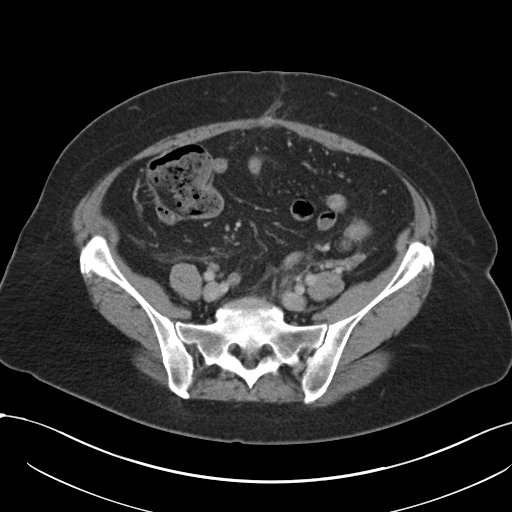
[im 40/94  soft-tissue]
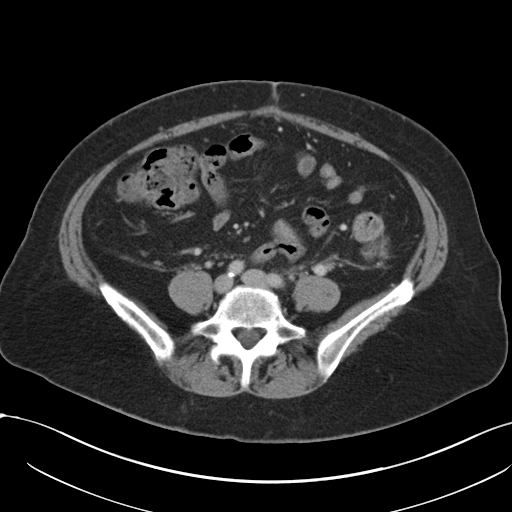
[im 54/94  soft-tissue]
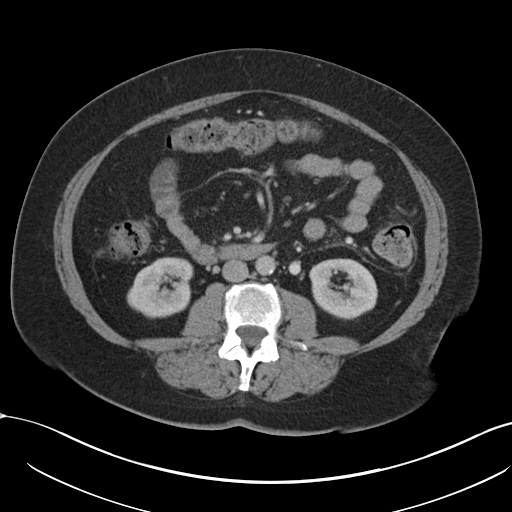
[im 60/94  soft-tissue]
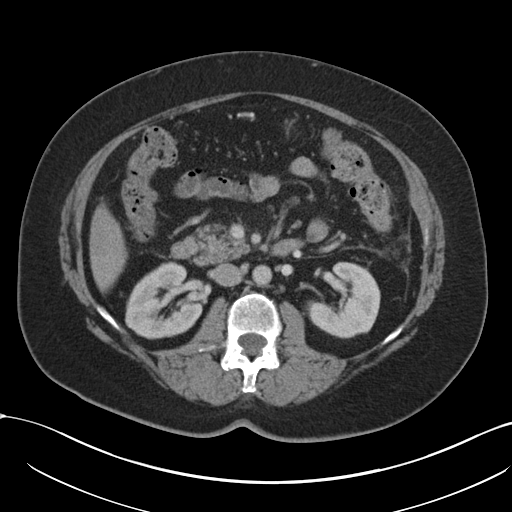
[im 67/94  soft-tissue]
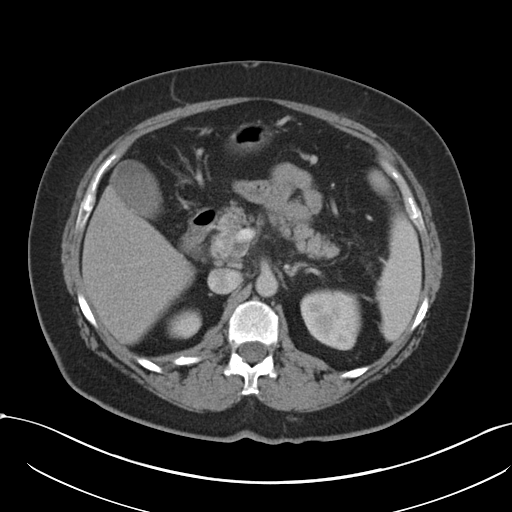
[im 67/94  bone]
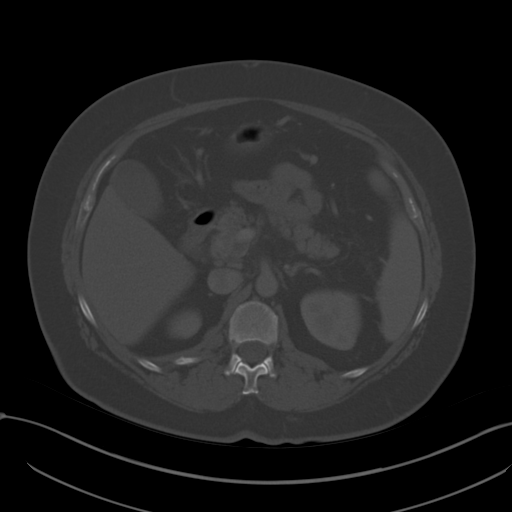
[im 74/94  soft-tissue]
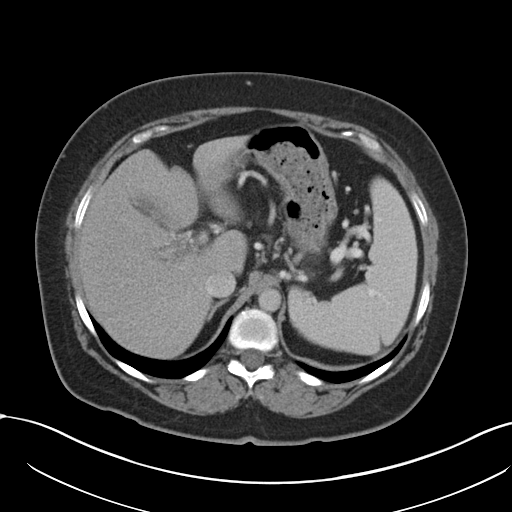
[im 80/94  soft-tissue]
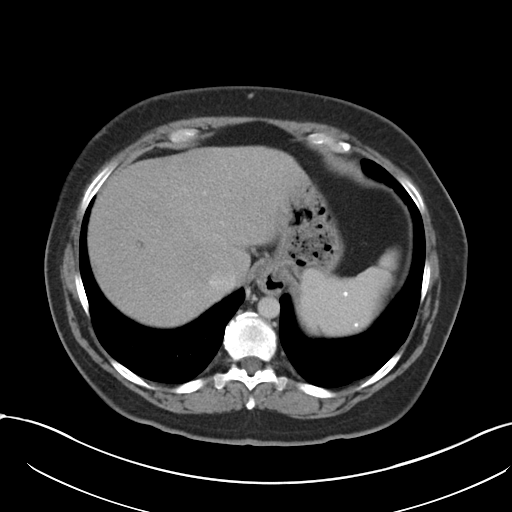
[im 87/94  soft-tissue]
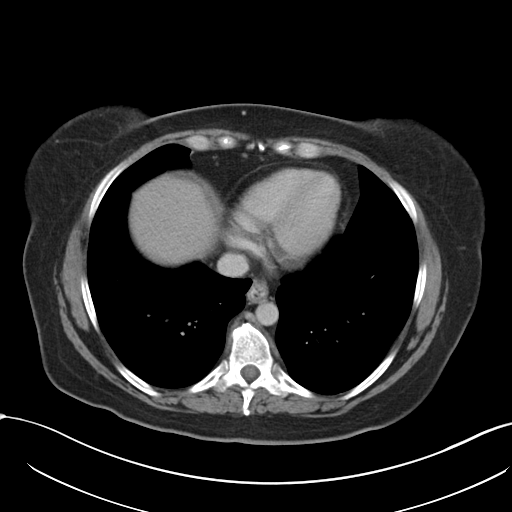

[Series 5: coronal a/|p · coronal · 0.75mm/px · 3 of 148 slices shown]
[im 50/148  soft-tissue]
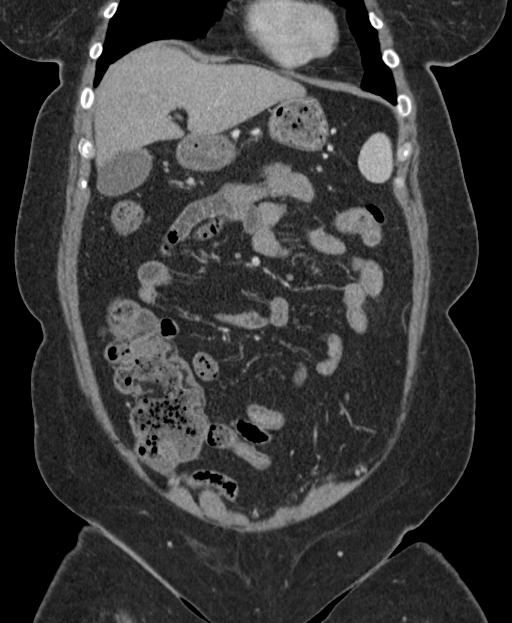
[im 66/148  soft-tissue]
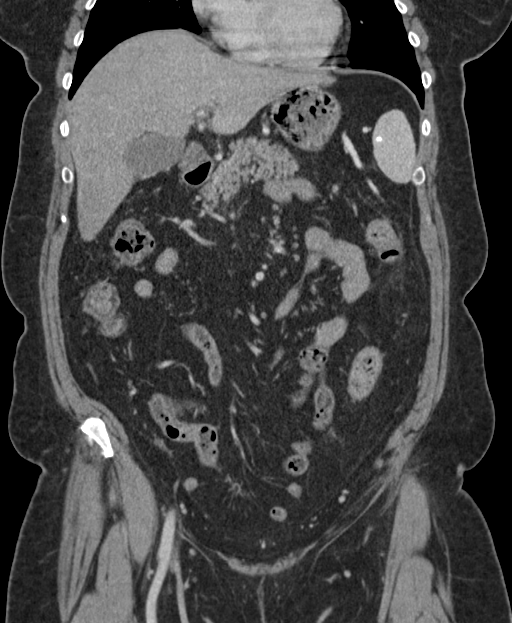
[im 82/148  soft-tissue]
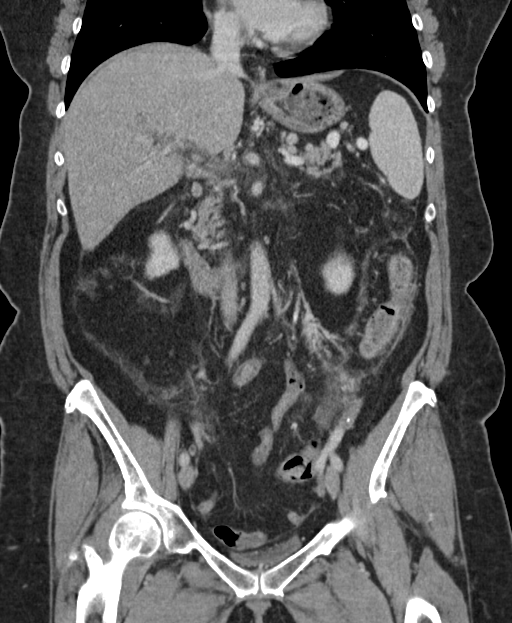

[15 of 46 positions shown; findings below may reference images not displayed]

FINDINGS: Lower chest: The visualized lung bases are clear.

No intra-abdominal free air.  Trace free fluid within the pelvis.

Hepatobiliary: Apparent mild fatty infiltration of the liver. Faint
area of decreased enhancement in the inferior aspect of the right
lobe of the liver (series 2, image 36 and coronal series 5, image
68). There is mild periportal edema. Layering sludge versus small
stones noted within the gallbladder. No pericholecystic fluid.

Pancreas: Unremarkable. No pancreatic ductal dilatation or
surrounding inflammatory changes.

Spleen: Normal in size without focal abnormality.

Adrenals/Urinary Tract: The adrenal glands appear unremarkable. The
kidneys, visualized ureters, and urinary bladder appear unremarkable
as well.

Stomach/Bowel: Small hiatal hernia. There is postsurgical changes of
partial sigmoid resection with anastomotic suture. There is
scattered sigmoid diverticula without active inflammatory changes.
Constipation. There is no evidence of bowel obstruction or active
inflammation. Normal appendix.

Vascular/Lymphatic: The abdominal aorta and the IVC appear
unremarkable. The origins of the celiac axis, SMA, IMA appear
patent. The renal arteries are patent as well.

There is thrombosis of the central SMV as well as complete
thrombosis of the main portal vein. There is haziness of the
periportal fat with mild enhancement of the wall of the portal vein.
These findings were seen on the prior CT of 05/23/2016. There is no
adenopathy.

Reproductive: The uterus is grossly unremarkable.

Other: Midline vertical anterior pelvic wall incisional scar.

Musculoskeletal: Mild degenerative changes of the spine. No acute
fracture.
IMPRESSION: Thrombosis of the central SMV and main portal vein. Associated
superimposed infection is not excluded. Clinical correlation is
recommended. No inflamed bowel, pneumatosis, or portal venous gas.

Postsurgical changes of partial sigmoid resection with anastomosis.
No perisigmoid adenopathy.

Constipation.  No bowel obstruction.

Sigmoid diverticulosis.

These results were called by telephone at the time of interpretation
on 06/17/2016 at [DATE] to Dr. LOLAKSHI BAROD , who verbally
acknowledged these results.

## 2017-11-05 ENCOUNTER — Other Ambulatory Visit: Payer: BLUE CROSS/BLUE SHIELD

## 2017-11-05 ENCOUNTER — Ambulatory Visit: Payer: BLUE CROSS/BLUE SHIELD | Admitting: Oncology

## 2017-11-22 ENCOUNTER — Inpatient Hospital Stay: Payer: BLUE CROSS/BLUE SHIELD | Attending: Oncology

## 2017-11-22 ENCOUNTER — Ambulatory Visit: Payer: BLUE CROSS/BLUE SHIELD | Admitting: Oncology

## 2017-11-22 ENCOUNTER — Telehealth: Payer: Self-pay | Admitting: Oncology

## 2017-11-22 ENCOUNTER — Other Ambulatory Visit: Payer: BLUE CROSS/BLUE SHIELD

## 2017-11-22 ENCOUNTER — Inpatient Hospital Stay (HOSPITAL_BASED_OUTPATIENT_CLINIC_OR_DEPARTMENT_OTHER): Payer: BLUE CROSS/BLUE SHIELD | Admitting: Oncology

## 2017-11-22 VITALS — BP 122/64 | HR 63 | Temp 97.7°F | Resp 17 | Ht 66.0 in | Wt 183.9 lb

## 2017-11-22 DIAGNOSIS — Z85038 Personal history of other malignant neoplasm of large intestine: Secondary | ICD-10-CM | POA: Diagnosis present

## 2017-11-22 DIAGNOSIS — Z832 Family history of diseases of the blood and blood-forming organs and certain disorders involving the immune mechanism: Secondary | ICD-10-CM | POA: Insufficient documentation

## 2017-11-22 DIAGNOSIS — Z800 Family history of malignant neoplasm of digestive organs: Secondary | ICD-10-CM

## 2017-11-22 DIAGNOSIS — Z86718 Personal history of other venous thrombosis and embolism: Secondary | ICD-10-CM

## 2017-11-22 DIAGNOSIS — C189 Malignant neoplasm of colon, unspecified: Secondary | ICD-10-CM

## 2017-11-22 DIAGNOSIS — C187 Malignant neoplasm of sigmoid colon: Secondary | ICD-10-CM

## 2017-11-22 LAB — CEA (IN HOUSE-CHCC): CEA (CHCC-IN HOUSE): 1.28 ng/mL (ref 0.00–5.00)

## 2017-11-22 NOTE — Progress Notes (Signed)
  [place] OFFICE PROGRESS NOTE   Diagnosis: Colon cancer  INTERVAL HISTORY:   Martha Robinson returns as scheduled.  She generally feels well.  Good appetite.  No difficulty with bowel function.  No bleeding.  No symptom of thrombosis.  She reports undergoing a cholecystectomy in November.  She had nausea and abdominal pain prior to the procedure.  The nausea has resolved but she continues to have intermittent "bloating" and abdominal pain.  She is not taking pain medication. She reports an abnormal white exudate on the tongue.  This did not improve with antifungal therapy.  This has been present since she completed chemotherapy.  Objective:  Vital signs in last 24 hours:  Blood pressure 122/64, pulse 63, temperature 97.7 F (36.5 C), temperature source Oral, resp. rate 17, height '5\' 6"'$  (1.676 m), weight 183 lb 14.4 oz (83.4 kg), SpO2 100 %.    HEENT: Neck without mass, mild white exudate at the surface of the tongue, "geographic "appearing tongue.  Lymphatics: No cervical, supraclavicular, axillary, or inguinal nodes Resp: Lungs clear bilaterally Cardio: Regular rate and rhythm GI: Nontender, no mass, no hepatosplenomegaly Vascular: No leg edema   Lab Results:    Lab Results  Component Value Date   CEA1 1.20 05/06/2017     Medications: I have reviewed the patient's current medications.   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, status post a laparoscopic hand assisted sigmoid colectomy 05/04/2016, stage IIIa (T2 N1)  1/14 lymph nodes positive for metastatic carcinoma  No loss of mismatch repair protein expression  Cycle 1 adjuvant FOLFOX 06/13/2016  Cycle 2 adjuvant FOLFOX 07/03/2016 with Neulasta support  Cycle 3 adjuvant FOLFOX 07/16/2016 with Neulasta support (oxaliplatin dose reduced to 65 mg/m due to thrombocytopenia)  Cycle 4 adjuvant FOLFOX 08/01/2016 with Neulasta support  Cycle 5 adjuvant FOLFOX  08/15/2016 with Neulasta support (5-FU bolus held due to thrombocytopenia)  Cycle 6 adjuvant FOLFOX 08/29/2016, oxaliplatin further dose reduced  CTs 05/06/2017-no recurrent/metastatic disease. 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 Stable subpleural nodule left lower lobe.  2. H. pylori diagnosed on upper endoscopy 04/23/2016 3. 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 Nodal is in clinical remission from colon cancer.  We will follow-up on the CEA from today.  She will return for an office visit and restaging CT evaluation in 6 months. She will follow-up with her dentist to evaluate the tongue changes.  15 minutes were spent with the patient today.  The majority of the time was used for counseling and coordination of care.  Betsy Coder, MD  11/22/2017  10:04 AM

## 2017-11-22 NOTE — Telephone Encounter (Signed)
Scheduled appt per 3/29 los - gave patient AVS and calender per los. Central radiology to contact patient with appt date and time.

## 2017-11-25 ENCOUNTER — Telehealth: Payer: Self-pay | Admitting: *Deleted

## 2017-11-25 NOTE — Telephone Encounter (Signed)
Notified pt of normal CEA. She voiced understanding.

## 2017-11-25 NOTE — Telephone Encounter (Signed)
-----   Message from Ladell Pier, MD sent at 11/22/2017  3:08 PM EDT ----- Please call patient, the CEA is normal, follow-up as scheduled

## 2018-05-26 ENCOUNTER — Ambulatory Visit (HOSPITAL_COMMUNITY)
Admission: RE | Admit: 2018-05-26 | Discharge: 2018-05-26 | Disposition: A | Discharge status: Home / Self Care | Payer: BLUE CROSS/BLUE SHIELD | Source: Ambulatory Visit | Attending: Oncology | Admitting: Oncology

## 2018-05-26 ENCOUNTER — Inpatient Hospital Stay: Payer: BLUE CROSS/BLUE SHIELD | Attending: Oncology

## 2018-05-26 ENCOUNTER — Encounter (HOSPITAL_COMMUNITY): Payer: Self-pay

## 2018-05-26 DIAGNOSIS — I700 Atherosclerosis of aorta: Secondary | ICD-10-CM | POA: Diagnosis not present

## 2018-05-26 DIAGNOSIS — K429 Umbilical hernia without obstruction or gangrene: Secondary | ICD-10-CM | POA: Insufficient documentation

## 2018-05-26 DIAGNOSIS — I8500 Esophageal varices without bleeding: Secondary | ICD-10-CM | POA: Diagnosis not present

## 2018-05-26 DIAGNOSIS — Z85038 Personal history of other malignant neoplasm of large intestine: Secondary | ICD-10-CM | POA: Diagnosis not present

## 2018-05-26 DIAGNOSIS — K449 Diaphragmatic hernia without obstruction or gangrene: Secondary | ICD-10-CM | POA: Diagnosis not present

## 2018-05-26 DIAGNOSIS — C187 Malignant neoplasm of sigmoid colon: Secondary | ICD-10-CM

## 2018-05-26 DIAGNOSIS — D71 Functional disorders of polymorphonuclear neutrophils: Secondary | ICD-10-CM | POA: Diagnosis not present

## 2018-05-26 DIAGNOSIS — N289 Disorder of kidney and ureter, unspecified: Secondary | ICD-10-CM | POA: Insufficient documentation

## 2018-05-26 LAB — BASIC METABOLIC PANEL - CANCER CENTER ONLY
ANION GAP: 8 (ref 5–15)
BUN: 8 mg/dL (ref 8–23)
CHLORIDE: 107 mmol/L (ref 98–111)
CO2: 28 mmol/L (ref 22–32)
Calcium: 9.3 mg/dL (ref 8.9–10.3)
Creatinine: 0.9 mg/dL (ref 0.44–1.00)
GFR, Est AFR Am: >60 mL/min (ref >60)
GFR, Estimated: >60 mL/min (ref >60)
GLUCOSE: 113 mg/dL — AB (ref 70–99)
POTASSIUM: 4.1 mmol/L (ref 3.5–5.1)
Sodium: 143 mmol/L (ref 135–145)

## 2018-05-26 LAB — CEA (IN HOUSE-CHCC): CEA (CHCC-IN HOUSE): 1.15 ng/mL (ref 0.00–5.00)

## 2018-05-26 MED ORDER — IOHEXOL 300 MG/ML  SOLN
100.0000 mL | Freq: Once | INTRAMUSCULAR | Status: AC | PRN
  Administered 2018-05-26: 100 mL via INTRAVENOUS

## 2018-05-26 MED ORDER — SODIUM CHLORIDE 0.9 % IJ SOLN
INTRAMUSCULAR | Status: AC
  Filled 2018-05-26: qty 50

## 2018-05-29 ENCOUNTER — Telehealth: Payer: Self-pay

## 2018-05-29 ENCOUNTER — Inpatient Hospital Stay: Payer: BLUE CROSS/BLUE SHIELD | Attending: Oncology | Admitting: Oncology

## 2018-05-29 VITALS — BP 109/66 | HR 76 | Temp 98.8°F | Resp 17 | Ht 66.0 in | Wt 187.2 lb

## 2018-05-29 DIAGNOSIS — Z86718 Personal history of other venous thrombosis and embolism: Secondary | ICD-10-CM | POA: Diagnosis not present

## 2018-05-29 DIAGNOSIS — Z832 Family history of diseases of the blood and blood-forming organs and certain disorders involving the immune mechanism: Secondary | ICD-10-CM | POA: Diagnosis not present

## 2018-05-29 DIAGNOSIS — Z800 Family history of malignant neoplasm of digestive organs: Secondary | ICD-10-CM

## 2018-05-29 DIAGNOSIS — Z85038 Personal history of other malignant neoplasm of large intestine: Secondary | ICD-10-CM | POA: Diagnosis not present

## 2018-05-29 DIAGNOSIS — R109 Unspecified abdominal pain: Secondary | ICD-10-CM | POA: Diagnosis not present

## 2018-05-29 DIAGNOSIS — C187 Malignant neoplasm of sigmoid colon: Secondary | ICD-10-CM

## 2018-05-29 NOTE — Telephone Encounter (Signed)
Printed avs and calender of upcoming appointment. Per 10/3 los 

## 2018-05-29 NOTE — Progress Notes (Signed)
[place] OFFICE PROGRESS NOTE   Diagnosis: Colon cancer  INTERVAL HISTORY:   Ms. Martha Robinson returns as scheduled.  She generally feels well.  No difficulty with bowel function.  She reports intermittent discomfort in the subxiphoid region and right abdomen for the past month.  The pain is not associated with activity.  No dysphasia, no nausea.  The pain resolved spontaneously.  The pain feels different than discomfort she experienced with the mesenteric vein thrombosis.   Objective:  Vital signs in last 24 hours:  Blood pressure 109/66, pulse 76, temperature 98.8 F (37.1 C), temperature source Oral, resp. rate 17, height _0  (1.676 m), weight 187 lb 3.2 oz (84.9 kg), SpO2 95 %.    HEENT: Neck without mass Lymphatics: No cervical, supraclavicular, axillary, or inguinal nodes Resp: Lungs clear bilaterally Cardio: Regular rate and rhythm GI: No hepatosplenomegaly, no mass, nontender Vascular: No leg edema  Skin: No rash    Lab Results:  Lab Results  Component Value Date   WBC 4.0 11/29/2016   HGB 13.0 11/29/2016   HCT 37.8 11/29/2016   MCV 94.7 11/29/2016   PLT 155 11/29/2016   NEUTROABS 2.5 11/29/2016    CMP  Lab Results  Component Value Date   NA 143 05/26/2018   K 4.1 05/26/2018   CL 107 05/26/2018   CO2 28 05/26/2018   GLUCOSE 113 (H) 05/26/2018   BUN 8 05/26/2018   CREATININE 0.90 05/26/2018   CALCIUM 9.3 05/26/2018   PROT 7.4 10/01/2016   ALBUMIN 3.5 10/01/2016   AST 25 10/01/2016   ALT 18 10/01/2016   ALKPHOS 173 (H) 10/01/2016   BILITOT 0.60 10/01/2016   GFRNONAA >60 05/26/2018   GFRAA >60 05/26/2018    Lab Results  Component Value Date   CEA1 1.15 05/26/2018     Imaging:  Ct Chest W Contrast  Result Date: 05/26/2018 CLINICAL DATA:  Restaging colon cancer, chemotherapy completed. EXAM: CT CHEST, ABDOMEN, AND PELVIS WITH CONTRAST TECHNIQUE: Multidetector CT imaging of the chest, abdomen and pelvis was performed following  the standard protocol during bolus administration of intravenous contrast. CONTRAST:  186m OMNIPAQUE IOHEXOL 300 MG/ML  SOLN COMPARISON:  Multiple exams, including 05/06/2017 CT FINDINGS: CT CHEST FINDINGS Cardiovascular: Mild atherosclerotic calcification of the aortic arch. Mediastinum/Nodes: Left posterior infrahilar calcified lymph nodes compatible with old granulomatous disease. A right upper paratracheal lymph node measures 0.6 cm in short axis on image 5/8, formerly 0.7 cm. No pathologically enlarged thoracic adenopathy is observed. Small type 1 hiatal hernia. Mild prominence of paraesophageal vasculature in the lower thorax suggesting mild varices. Lungs/Pleura: Calcified 4 by 3 mm left lower lobe subpleural nodule on image 79/7, with a granuloma, no change from 05/06/2017. 0.80.7 cm left lower lobe calcified granuloma on image 69/7. This is likewise stable. Musculoskeletal: Thoracic spondylosis. CT ABDOMEN PELVIS FINDINGS Hepatobiliary: No individual lesions identified. Cavernous transformation of the portal vein. Prior cholecystectomy. Pancreas: Unremarkable Spleen: The spleen measures 14.9 by 4.1 by 8.5 cm (volume = 270 cm^3). No focal splenic lesion. Adrenals/Urinary Tract: The adrenal glands appear normal. Scarring in the right kidney lower pole. The kidneys appear otherwise unremarkable. No urinary tract calculi identified. Stomach/Bowel: Several scattered sigmoid colon diverticula. No local recurrence near the staple line. Vascular/Lymphatic: As noted above there is cavernous transformation of the portal vein. Minimal aortoiliac atherosclerotic calcification. No pathologic adenopathy. Reproductive: Unremarkable Other: No supplemental non-categorized findings. Musculoskeletal: Mild right facet arthropathy at L4-5 and L5-S1. No overt lumbar impingement. Small umbilical/supraumbilical hernia contains  adipose tissue. IMPRESSION: 1. No recurrent malignancy identified. 2. Other imaging findings of  potential clinical significance: Aortic Atherosclerosis (ICD10-I70.0). Old granulomatous disease. Small type 1 hiatal hernia. Chronic cavernous transformation of portal vein with small uphill esophageal varices. Scarring of the right kidney lower pole. Small umbilical hernia contains adipose tissue. Electronically Signed   By: Van Clines M.D.   On: 05/26/2018 13:41   Ct Abdomen Pelvis W Contrast  Result Date: 05/26/2018 CLINICAL DATA:  Restaging colon cancer, chemotherapy completed. EXAM: CT CHEST, ABDOMEN, AND PELVIS WITH CONTRAST TECHNIQUE: Multidetector CT imaging of the chest, abdomen and pelvis was performed following the standard protocol during bolus administration of intravenous contrast. CONTRAST:  142m OMNIPAQUE IOHEXOL 300 MG/ML  SOLN COMPARISON:  Multiple exams, including 05/06/2017 CT FINDINGS: CT CHEST FINDINGS Cardiovascular: Mild atherosclerotic calcification of the aortic arch. Mediastinum/Nodes: Left posterior infrahilar calcified lymph nodes compatible with old granulomatous disease. A right upper paratracheal lymph node measures 0.6 cm in short axis on image 5/8, formerly 0.7 cm. No pathologically enlarged thoracic adenopathy is observed. Small type 1 hiatal hernia. Mild prominence of paraesophageal vasculature in the lower thorax suggesting mild varices. Lungs/Pleura: Calcified 4 by 3 mm left lower lobe subpleural nodule on image 79/7, with a granuloma, no change from 05/06/2017. 0.80.7 cm left lower lobe calcified granuloma on image 69/7. This is likewise stable. Musculoskeletal: Thoracic spondylosis. CT ABDOMEN PELVIS FINDINGS Hepatobiliary: No individual lesions identified. Cavernous transformation of the portal vein. Prior cholecystectomy. Pancreas: Unremarkable Spleen: The spleen measures 14.9 by 4.1 by 8.5 cm (volume = 270 cm^3). No focal splenic lesion. Adrenals/Urinary Tract: The adrenal glands appear normal. Scarring in the right kidney lower pole. The kidneys appear  otherwise unremarkable. No urinary tract calculi identified. Stomach/Bowel: Several scattered sigmoid colon diverticula. No local recurrence near the staple line. Vascular/Lymphatic: As noted above there is cavernous transformation of the portal vein. Minimal aortoiliac atherosclerotic calcification. No pathologic adenopathy. Reproductive: Unremarkable Other: No supplemental non-categorized findings. Musculoskeletal: Mild right facet arthropathy at L4-5 and L5-S1. No overt lumbar impingement. Small umbilical/supraumbilical hernia contains adipose tissue. IMPRESSION: 1. No recurrent malignancy identified. 2. Other imaging findings of potential clinical significance: Aortic Atherosclerosis (ICD10-I70.0). Old granulomatous disease. Small type 1 hiatal hernia. Chronic cavernous transformation of portal vein with small uphill esophageal varices. Scarring of the right kidney lower pole. Small umbilical hernia contains adipose tissue. Electronically Signed   By: WVan ClinesM.D.   On: 05/26/2018 13:41    Medications: I have reviewed the patient's current medications.   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, status post a laparoscopic hand assisted sigmoid colectomy 05/04/2016, stage IIIa (T2 N1)  1/14 lymph nodes positive for metastatic carcinoma  No loss of mismatch repair protein expression  Cycle 1 adjuvant FOLFOX 06/13/2016  Cycle 2 adjuvant FOLFOX 07/03/2016 with Neulasta support  Cycle 3 adjuvant FOLFOX 07/16/2016 with Neulasta support (oxaliplatin dose reduced to 65 mg/m due to thrombocytopenia)  Cycle 4 adjuvant FOLFOX 08/01/2016 with Neulasta support  Cycle 5 adjuvant FOLFOX 08/15/2016 with Neulasta support (5-FU bolus held due to thrombocytopenia)  Cycle 6 adjuvant FOLFOX 08/29/2016, oxaliplatin further dose reduced  CTs 05/06/2017-no recurrent/metastatic disease. Chronic portal vein thrombosis with cavernous transformation and collateral  vessels. Stable subpleural nodule left lower lobe.  CTs 05/26/2018- no evidence of recurrent colon cancer, chronic cavernous transformation of the portal vein with small esophageal varices  2. H. pylori diagnosed on upper endoscopy 04/23/2016 3. Family history of colon cancer 4. 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 Family history of "blood clots"-06/13/2017  10/18/2017Positive lupus anticoagulant with negative anti-Cardiolipinand beta-2 glycoprotein antibody levels 06/14/2016, remainder of hypercoagulation panel negative  11/29/2016-positive lupus anticoagulant with negative beta-2 glycoprotein antibody levels, mildly elevated IgG anticardiolipin antibody  05/08/2018- negative anticardiolipin antibodies, negative lupus anticoagulant    Disposition: Ms. Cogan remains in clinical remission from colon cancer.  We will refer her to Dr. Shana Chute for a surveillance colonoscopy.  She will return for an office visit and CEA in 6 months. The etiology of the subxiphoid and right abdomen discomfort is unclear.  I doubt this is related to the history of portal/mesenteric vein thrombosis.  She will seek medical attention for persistent pain.  She will try an over-the-counter antiacid and follow-up with her primary physician if this helps.  Betsy Coder, MD  05/29/2018  12:09 PM

## 2018-11-27 ENCOUNTER — Inpatient Hospital Stay: Payer: BLUE CROSS/BLUE SHIELD | Attending: Nurse Practitioner

## 2018-11-27 ENCOUNTER — Inpatient Hospital Stay: Payer: BLUE CROSS/BLUE SHIELD | Admitting: Nurse Practitioner

## 2018-11-27 ENCOUNTER — Other Ambulatory Visit: Payer: Self-pay

## 2018-11-27 DIAGNOSIS — C187 Malignant neoplasm of sigmoid colon: Secondary | ICD-10-CM | POA: Diagnosis present

## 2018-11-27 LAB — CEA (IN HOUSE-CHCC): CEA (CHCC-In House): 1.97 ng/mL (ref 0.00–5.00)

## 2018-11-28 ENCOUNTER — Telehealth: Payer: Self-pay | Admitting: Nurse Practitioner

## 2018-11-28 NOTE — Telephone Encounter (Signed)
Notified Ms. Martha Robinson is in normal range.  We will see her in a follow-up visit in approximately 6 weeks.

## 2019-01-08 ENCOUNTER — Telehealth: Payer: Self-pay | Admitting: Nurse Practitioner

## 2019-01-08 ENCOUNTER — Telehealth: Payer: Self-pay | Admitting: Oncology

## 2019-01-08 ENCOUNTER — Inpatient Hospital Stay: Payer: BLUE CROSS/BLUE SHIELD | Admitting: Nurse Practitioner

## 2019-01-08 DIAGNOSIS — C187 Malignant neoplasm of sigmoid colon: Secondary | ICD-10-CM

## 2019-01-08 NOTE — Telephone Encounter (Signed)
I spoke to Martha Robinson regarding her scheduled appointment for today.  We discussed that she could come in as scheduled or we could defer the appointment until September with surveillance CT scans a few days before that visit.  She is comfortable waiting until September.  She reports she is up-to-date on her colonoscopy having one in December 2019.  She has had no change in bowel habits.  No rectal bleeding.  She denies abdominal pain.  She has a good appetite.  She will contact us prior to the September 2020 appointment with any problems, concerns or questions.

## 2019-01-08 NOTE — Telephone Encounter (Signed)
Scheduled appt per 5/14 sch message - pt to get an updated schedule in the mail

## 2019-04-17 ENCOUNTER — Telehealth: Payer: Self-pay | Admitting: Oncology

## 2019-04-17 NOTE — Telephone Encounter (Signed)
LT PAL 9/17 f/u moved to GBS and adjusted by 15 minutes.

## 2019-05-11 ENCOUNTER — Inpatient Hospital Stay: Payer: Medicare Other | Attending: Oncology

## 2019-05-11 ENCOUNTER — Other Ambulatory Visit: Payer: Self-pay

## 2019-05-11 ENCOUNTER — Ambulatory Visit (HOSPITAL_COMMUNITY)
Admission: RE | Admit: 2019-05-11 | Discharge: 2019-05-11 | Disposition: A | Discharge status: Home / Self Care | Payer: Medicare Other | Source: Ambulatory Visit | Attending: Nurse Practitioner | Admitting: Nurse Practitioner

## 2019-05-11 DIAGNOSIS — C187 Malignant neoplasm of sigmoid colon: Secondary | ICD-10-CM

## 2019-05-11 DIAGNOSIS — R918 Other nonspecific abnormal finding of lung field: Secondary | ICD-10-CM | POA: Insufficient documentation

## 2019-05-11 DIAGNOSIS — M79604 Pain in right leg: Secondary | ICD-10-CM | POA: Insufficient documentation

## 2019-05-11 DIAGNOSIS — Z85038 Personal history of other malignant neoplasm of large intestine: Secondary | ICD-10-CM | POA: Insufficient documentation

## 2019-05-11 LAB — CMP (CANCER CENTER ONLY)
ALT: 17 U/L (ref 0–44)
AST: 20 U/L (ref 15–41)
Albumin: 4.2 g/dL (ref 3.5–5.0)
Alkaline Phosphatase: 133 U/L — ABNORMAL HIGH (ref 38–126)
Anion gap: 9 (ref 5–15)
BUN: 12 mg/dL (ref 8–23)
CO2: 25 mmol/L (ref 22–32)
Calcium: 9.1 mg/dL (ref 8.9–10.3)
Chloride: 107 mmol/L (ref 98–111)
Creatinine: 0.98 mg/dL (ref 0.44–1.00)
GFR, Est AFR Am: >60 mL/min (ref >60)
GFR, Estimated: >60 mL/min (ref >60)
Glucose, Bld: 131 mg/dL — ABNORMAL HIGH (ref 70–99)
Potassium: 3.8 mmol/L (ref 3.5–5.1)
Sodium: 141 mmol/L (ref 135–145)
Total Bilirubin: 0.8 mg/dL (ref 0.3–1.2)
Total Protein: 7.4 g/dL (ref 6.5–8.1)

## 2019-05-11 LAB — CEA (IN HOUSE-CHCC): CEA (CHCC-In House): 1.21 ng/mL (ref 0.00–5.00)

## 2019-05-11 MED ORDER — SODIUM CHLORIDE (PF) 0.9 % IJ SOLN
INTRAMUSCULAR | Status: AC
  Filled 2019-05-11: qty 50

## 2019-05-11 MED ORDER — IOHEXOL 300 MG/ML  SOLN
100.0000 mL | Freq: Once | INTRAMUSCULAR | Status: AC | PRN
  Administered 2019-05-11: 100 mL via INTRAVENOUS

## 2019-05-14 ENCOUNTER — Other Ambulatory Visit: Payer: Self-pay

## 2019-05-14 ENCOUNTER — Inpatient Hospital Stay (HOSPITAL_BASED_OUTPATIENT_CLINIC_OR_DEPARTMENT_OTHER): Payer: Medicare Other | Admitting: Oncology

## 2019-05-14 VITALS — BP 125/62 | HR 74 | Temp 98.0°F | Resp 16 | Ht 66.0 in | Wt 189.8 lb

## 2019-05-14 DIAGNOSIS — C187 Malignant neoplasm of sigmoid colon: Secondary | ICD-10-CM

## 2019-05-14 DIAGNOSIS — Z85038 Personal history of other malignant neoplasm of large intestine: Secondary | ICD-10-CM | POA: Diagnosis not present

## 2019-05-14 NOTE — Progress Notes (Signed)
[place] OFFICE PROGRESS NOTE   Diagnosis: Colon cancer  INTERVAL HISTORY:   Ms. Barz returns as scheduled.  She feels well.  No symptom of thrombosis.  Good appetite.  She complains of right leg pain since completing chemotherapy.  She has pain throughout the right leg.  She reports undergoing an extensive evaluation including a "MRI "and vascular studies.  Objective:  Vital signs in last 24 hours:  Blood pressure 125/62, pulse 74, temperature 98 F (36.7 C), temperature source Temporal, resp. rate 16, height _0  (1.676 m), weight 189 lb 12.8 oz (86.1 kg), SpO2 98 %.   Limited physical examination secondary to distancing with the coded pandemic Lymphatics: No cervical, supraclavicular, axillary, or inguinal nodes  GI: No hepatosplenomegaly, nontender, no mass Vascular: No leg edema Musculoskeletal: No pain with motion of the right leg   Lab Results:  Lab Results  Component Value Date   WBC 4.0 11/29/2016   HGB 13.0 11/29/2016   HCT 37.8 11/29/2016   MCV 94.7 11/29/2016   PLT 155 11/29/2016   NEUTROABS 2.5 11/29/2016    CMP  Lab Results  Component Value Date   NA 141 05/11/2019   K 3.8 05/11/2019   CL 107 05/11/2019   CO2 25 05/11/2019   GLUCOSE 131 (H) 05/11/2019   BUN 12 05/11/2019   CREATININE 0.98 05/11/2019   CALCIUM 9.1 05/11/2019   PROT 7.4 05/11/2019   ALBUMIN 4.2 05/11/2019   AST 20 05/11/2019   ALT 17 05/11/2019   ALKPHOS 133 (H) 05/11/2019   BILITOT 0.8 05/11/2019   GFRNONAA >60 05/11/2019   GFRAA >60 05/11/2019    Lab Results  Component Value Date   CEA1 1.21 05/11/2019     Imaging:  Ct Chest W Contrast  Result Date: 05/11/2019 CLINICAL DATA:  Restaging colon cancer EXAM: CT CHEST, ABDOMEN, AND PELVIS WITH CONTRAST TECHNIQUE: Multidetector CT imaging of the chest, abdomen and pelvis was performed following the standard protocol during bolus administration of intravenous contrast. CONTRAST:  167m OMNIPAQUE IOHEXOL  300 MG/ML  SOLN COMPARISON:  05/26/2018 FINDINGS: CT CHEST FINDINGS Cardiovascular: Atherosclerotic calcification of the aortic arch and branch vessels. Mediastinum/Nodes: Upper right paratracheal node 0.7 cm in short axis on image 8/2, formerly the same. Right hilar node 0.8 cm in short axis on image 22/2, formerly 0.6 cm. No overtly pathologic thoracic adenopathy. There are uphill varices adjacent to the distal esophagus. Small type 1 hiatal hernia. Lungs/Pleura: Dense calcified granuloma in the left lower lobe on image 69/6. There are scattered new small pulmonary nodules in the 2-4 mm range, either centrilobular or random in distribution. Some of these have indistinct margins. Musculoskeletal: Thoracic spondylosis. CT ABDOMEN PELVIS FINDINGS Hepatobiliary: Cavernous transformation of the portal vein, as before. Cholecystectomy. No worrisome focal hepatic lesions. Pancreas: Unremarkable Spleen: Old granulomatous disease. Adrenals/Urinary Tract: Scarring in the right kidney lower pole. The adrenal glands appear normal. Stomach/Bowel: Anastomotic staple line in rectosigmoid junction. Mild sigmoid colon diverticulosis. Vascular/Lymphatic: Aortoiliac atherosclerotic vascular disease. No pathologic adenopathy. Cavernous transformation of the portal vein. Reproductive: Unremarkable Other: No supplemental non-categorized findings. Musculoskeletal: Supraumbilical hernia contains adipose tissue. Mild lower lumbar degenerative disc disease. IMPRESSION: 1. Scattered new small likely centrilobular nodules in both lungs, in the 2-4 mm range, some with slightly hazy margins. The relative uniformity of nodule size and characteristics tend to favor an inflammatory etiology over metastatic disease. Possibilities include hypersensitivity pneumonitis or atypical pneumonia. The presence of these new small nodules may warrant more frequent surveillance of  the chest in order to ensure resolution. 2. Other imaging findings of potential  clinical significance: Aortic Atherosclerosis (ICD10-I70.0). 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 Scarring in the right kidney lower pole. Mild sigmoid colon diverticulosis. Supraumbilical hernia containing adipose tissue. Electronically Signed   By: Van Clines M.D.   On: 05/11/2019 17:46   Ct Abdomen Pelvis W Contrast  Result Date: 05/11/2019 CLINICAL DATA:  Restaging colon cancer EXAM: CT CHEST, ABDOMEN, AND PELVIS WITH CONTRAST TECHNIQUE: Multidetector CT imaging of the chest, abdomen and pelvis was performed following the standard protocol during bolus administration of intravenous contrast. CONTRAST:  17m OMNIPAQUE IOHEXOL 300 MG/ML  SOLN COMPARISON:  05/26/2018 FINDINGS: CT CHEST FINDINGS Cardiovascular: Atherosclerotic calcification of the aortic arch and branch vessels. Mediastinum/Nodes: Upper right paratracheal node 0.7 cm in short axis on image 8/2, formerly the same. Right hilar node 0.8 cm in short axis on image 22/2, formerly 0.6 cm. No overtly pathologic thoracic adenopathy. There are uphill varices adjacent to the distal esophagus. Small type 1 hiatal hernia. Lungs/Pleura: Dense calcified granuloma in the left lower lobe on image 69/6. There are scattered new small pulmonary nodules in the 2-4 mm range, either centrilobular or random in distribution. Some of these have indistinct margins. Musculoskeletal: Thoracic spondylosis. CT ABDOMEN PELVIS FINDINGS Hepatobiliary: Cavernous transformation of the portal vein, as before. Cholecystectomy. No worrisome focal hepatic lesions. Pancreas: Unremarkable Spleen: Old granulomatous disease. Adrenals/Urinary Tract: Scarring in the right kidney lower pole. The adrenal glands appear normal. Stomach/Bowel: Anastomotic staple line in rectosigmoid junction. Mild sigmoid colon diverticulosis. Vascular/Lymphatic: Aortoiliac atherosclerotic vascular disease. No pathologic adenopathy.  Cavernous transformation of the portal vein. Reproductive: Unremarkable Other: No supplemental non-categorized findings. Musculoskeletal: Supraumbilical hernia contains adipose tissue. Mild lower lumbar degenerative disc disease. IMPRESSION: 1. Scattered new small likely centrilobular nodules in both lungs, in the 2-4 mm range, some with slightly hazy margins. The relative uniformity of nodule size and characteristics tend to favor an inflammatory etiology over metastatic disease. Possibilities include hypersensitivity pneumonitis or atypical pneumonia. The presence of these new small nodules may warrant more frequent surveillance of the chest in order to ensure resolution. 2. Other imaging findings of potential clinical significance: Aortic Atherosclerosis (ICD10-I70.0). 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 Scarring in the right kidney lower pole. Mild sigmoid colon diverticulosis. Supraumbilical hernia containing adipose tissue. Electronically Signed   By: WVan ClinesM.D.   On: 05/11/2019 17:46    Medications: I have reviewed the patient's current medications.   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, status post a laparoscopic hand assisted sigmoid colectomy 05/04/2016, stage IIIa (T2 N1)  1/14 lymph nodes positive for metastatic carcinoma  No loss of mismatch repair protein expression  Cycle 1 adjuvant FOLFOX 06/13/2016  Cycle 2 adjuvant FOLFOX 07/03/2016 with Neulasta support  Cycle 3 adjuvant FOLFOX 07/16/2016 with Neulasta support (oxaliplatin dose reduced to 65 mg/m due to thrombocytopenia)  Cycle 4 adjuvant FOLFOX 08/01/2016 with Neulasta support  Cycle 5 adjuvant FOLFOX 08/15/2016 with Neulasta support (5-FU bolus held due to thrombocytopenia)  Cycle 6 adjuvant FOLFOX 08/29/2016, oxaliplatin further dose reduced  CTs 05/06/2017-no recurrent/metastatic disease.  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 Stable subpleural nodule left lower lobe.  CTs 05/26/2018- no evidence of recurrent colon cancer, chronic cavernous transformation of the portal vein with small esophageal varices  CTs 05/11/2019- scattered new 2-4 mm centrilobular nodules in both  lungs, inflammatory etiology favored, chronic cavernous transformation of the portal vein  2. H. pylori diagnosed on upper endoscopy 04/23/2016 3. 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 Family history of "blood clots"-06/13/2017  10/18/2017Positive lupus anticoagulant with negative anti-Cardiolipinand beta-2 glycoprotein antibody levels 06/14/2016, remainder of hypercoagulation panel negative  11/29/2016-positive lupus anticoagulant with negative beta-2 glycoprotein antibody levels, mildly elevated IgG anticardiolipin antibody  05/08/2018- negative anticardiolipin antibodies, negative lupus anticoagulant    Disposition: Martha Robinson is in clinical remission from colon cancer.  I reviewed the CT images with her.  The etiology of the tiny lung lesions is unclear, most likely inflammatory.  I will present her case at the GI tumor conference for further review and recommend a follow-up CT if indicated.  She will return for an office visit and CEA in 6 months.  The etiology of the right leg pain is unclear.  She will continue follow-up with her primary provider to evaluate the leg pain.  I doubt this is related to oxaliplatin.  She continues colonoscopy surveillance in [facility].  Betsy Coder, MD  05/14/2019  12:33 PM

## 2019-05-15 ENCOUNTER — Telehealth: Payer: Self-pay | Admitting: Oncology

## 2019-05-15 NOTE — Telephone Encounter (Signed)
Called and left msg. Mailed printout  °

## 2019-06-03 ENCOUNTER — Telehealth: Payer: Self-pay | Admitting: *Deleted

## 2019-06-03 ENCOUNTER — Other Ambulatory Visit: Payer: Self-pay | Admitting: Oncology

## 2019-06-03 DIAGNOSIS — C187 Malignant neoplasm of sigmoid colon: Secondary | ICD-10-CM

## 2019-06-03 NOTE — Telephone Encounter (Signed)
Called patient w/recomendations of GI Conference today in regards to lung nodules. Team felt they were likely inflammatory changes, but suggest a 3 month f/u CT chest without contrast to follow up. She understands and agrees.

## 2019-08-25 ENCOUNTER — Ambulatory Visit (HOSPITAL_COMMUNITY)
Admission: RE | Admit: 2019-08-25 | Discharge: 2019-08-25 | Disposition: A | Discharge status: Home / Self Care | Payer: Medicare Other | Source: Ambulatory Visit | Attending: Oncology | Admitting: Oncology

## 2019-08-25 ENCOUNTER — Other Ambulatory Visit: Payer: Self-pay

## 2019-08-25 DIAGNOSIS — C187 Malignant neoplasm of sigmoid colon: Secondary | ICD-10-CM

## 2019-08-26 ENCOUNTER — Telehealth: Payer: Self-pay

## 2019-08-26 ENCOUNTER — Telehealth: Payer: Self-pay | Admitting: *Deleted

## 2019-08-26 NOTE — Telephone Encounter (Signed)
Left voicemail for pt to call back [facility] 

## 2019-08-26 NOTE — Telephone Encounter (Signed)
Notified of negative CT scan and to f/u as scheduled.

## 2019-08-26 NOTE — Telephone Encounter (Signed)
-----   Message from Ladell Pier, MD sent at 08/25/2019  5:22 PM EST ----- Please call patient, CT shows no evidence of cancer, follow-up scheduled

## 2019-11-11 ENCOUNTER — Encounter: Payer: Self-pay | Admitting: Nurse Practitioner

## 2019-11-11 ENCOUNTER — Inpatient Hospital Stay: Payer: Medicare Other

## 2019-11-11 ENCOUNTER — Other Ambulatory Visit: Payer: Self-pay

## 2019-11-11 ENCOUNTER — Inpatient Hospital Stay: Payer: Medicare Other | Attending: Nurse Practitioner | Admitting: Nurse Practitioner

## 2019-11-11 ENCOUNTER — Telehealth: Payer: Self-pay | Admitting: *Deleted

## 2019-11-11 VITALS — BP 121/69 | HR 80 | Temp 97.8°F | Resp 18 | Ht 66.0 in | Wt 193.8 lb

## 2019-11-11 DIAGNOSIS — M79604 Pain in right leg: Secondary | ICD-10-CM | POA: Insufficient documentation

## 2019-11-11 DIAGNOSIS — C187 Malignant neoplasm of sigmoid colon: Secondary | ICD-10-CM | POA: Insufficient documentation

## 2019-11-11 DIAGNOSIS — C779 Secondary and unspecified malignant neoplasm of lymph node, unspecified: Secondary | ICD-10-CM | POA: Diagnosis not present

## 2019-11-11 LAB — CEA (IN HOUSE-CHCC): CEA (CHCC-In House): 1.55 ng/mL (ref 0.00–5.00)

## 2019-11-11 NOTE — Progress Notes (Signed)
  [place] OFFICE PROGRESS NOTE   Diagnosis: Colon cancer  INTERVAL HISTORY:   Martha Robinson returns as scheduled.  She feels well.  No change in bowel habits.  No blood or pain with bowel movements.  No abdominal pain.  No nausea or vomiting.  She has a good appetite.  She continues to have intermittent right leg pain originating at the popliteal fossa.  Objective:  Vital signs in last 24 hours:  Blood pressure 121/69, pulse 80, temperature 97.8 F (36.6 C), temperature source Temporal, resp. rate 18, height '5\' 6"'$  (1.676 m), weight 193 lb 12.8 oz (87.9 kg), SpO2 98 %.    HEENT: Neck without mass. Lymphatics: No palpable cervical, supraclavicular, axillary or inguinal lymph nodes. Resp: Lungs clear bilaterally. Cardio: Regular rate and rhythm. GI: Abdomen soft and nontender.  No hepatomegaly. Vascular: No leg edema.    Lab Results:  Lab Results  Component Value Date   WBC 4.0 11/29/2016   HGB 13.0 11/29/2016   HCT 37.8 11/29/2016   MCV 94.7 11/29/2016   PLT 155 11/29/2016   NEUTROABS 2.5 11/29/2016    Imaging:  No results found.  Medications: I have reviewed the patient's current medications.  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, status post a laparoscopic hand assisted sigmoid colectomy 05/04/2016, stage IIIa (T2 N1)  1/14 lymph nodes positive for metastatic carcinoma  No loss of mismatch repair protein expression  Cycle 1 adjuvant FOLFOX 06/13/2016  Cycle 2 adjuvant FOLFOX 07/03/2016 with Neulasta support  Cycle 3 adjuvant FOLFOX 07/16/2016 with Neulasta support (oxaliplatin dose reduced to 65 mg/m due to thrombocytopenia)  Cycle 4 adjuvant FOLFOX 08/01/2016 with Neulasta support  Cycle 5 adjuvant FOLFOX 08/15/2016 with Neulasta support (5-FU bolus held due to thrombocytopenia)  Cycle 6 adjuvant FOLFOX 08/29/2016, oxaliplatin further dose reduced  CTs 05/06/2017-no recurrent/metastatic  disease. 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 Stable subpleural nodule left lower lobe.  CTs 05/26/2018- no evidence of recurrent colon cancer, chronic cavernous transformation of the portal vein with small esophageal varices  CTs 05/11/2019- scattered new 2-4 mm centrilobular nodules in both lungs, inflammatory etiology favored, chronic cavernous transformation of the portal vein  CT chest 08/25/2019-interval resolution of previous scattered centrilobular nodules compatible with resolved hypersensitivity pneumonitis or transient atypical infection.  2. H. pylori diagnosed on upper endoscopy 04/23/2016 3. Family history of colon cancer 4. 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 Family history of "blood clots"-06/13/2017  10/18/2017Positive lupus anticoagulant with negative anti-Cardiolipinand beta-2 glycoprotein antibody levels 06/14/2016, remainder of hypercoagulation panel negative  11/29/2016-positive lupus anticoagulant with negative beta-2 glycoprotein antibody levels, mildly elevated IgG anticardiolipin antibody  05/08/2018- negative anticardiolipin antibodies, negative lupus anticoagulant   Disposition: Martha Robinson remains in clinical remission from colon cancer.  We will follow-up on the CEA from today.    She continues colonoscopy surveillance with Dr. Shana Chute.  She will return for a CEA and follow-up visit in 6 months.    Ned Card ANP/GNP-BC   11/11/2019  2:10 PM

## 2019-11-11 NOTE — Telephone Encounter (Signed)
-----   Message from Owens Shark, NP sent at 11/11/2019  3:22 PM EDT ----- Please let her know CEA is stable in normal range.  Follow-up as scheduled.

## 2019-11-11 NOTE — Telephone Encounter (Signed)
LM with note below 

## 2019-11-16 ENCOUNTER — Telehealth: Payer: Self-pay | Admitting: Oncology

## 2019-11-16 NOTE — Telephone Encounter (Signed)
Scheduled per los. Called and left msg. Mailed printout  °

## 2020-05-13 ENCOUNTER — Inpatient Hospital Stay: Payer: Medicare Other

## 2020-05-13 ENCOUNTER — Inpatient Hospital Stay: Payer: Medicare Other | Attending: Oncology | Admitting: Oncology

## 2020-05-13 ENCOUNTER — Other Ambulatory Visit: Payer: Self-pay

## 2020-05-13 VITALS — BP 112/59 | HR 67 | Temp 97.5°F | Resp 16 | Ht 66.0 in | Wt 184.4 lb

## 2020-05-13 DIAGNOSIS — C187 Malignant neoplasm of sigmoid colon: Secondary | ICD-10-CM | POA: Insufficient documentation

## 2020-05-13 DIAGNOSIS — C779 Secondary and unspecified malignant neoplasm of lymph node, unspecified: Secondary | ICD-10-CM | POA: Insufficient documentation

## 2020-05-13 DIAGNOSIS — Z800 Family history of malignant neoplasm of digestive organs: Secondary | ICD-10-CM | POA: Diagnosis not present

## 2020-05-13 DIAGNOSIS — R112 Nausea with vomiting, unspecified: Secondary | ICD-10-CM | POA: Insufficient documentation

## 2020-05-13 LAB — CEA (IN HOUSE-CHCC): CEA (CHCC-In House): 1.17 ng/mL (ref 0.00–5.00)

## 2020-05-13 NOTE — Progress Notes (Signed)
Patient declines COVID vaccine and flu vaccine

## 2020-05-13 NOTE — Progress Notes (Signed)
[place] OFFICE PROGRESS NOTE   Diagnosis: Colon cancer  INTERVAL HISTORY:   Martha Robinson returns as scheduled.  She feels well.  Good appetite.  She is exercising.  She reports intentional weight loss.  She had 2 episodes of nausea/vomiting over the past month, one occurred last night.  No associated symptoms.  She feels well today.  No difficulty with bowel function.  No abdominal pain.  No symptoms of recurrent venous thrombosis.  She continues colonoscopy surveillance with Dr. Shana Chute.  Objective:  Vital signs in last 24 hours:  Blood pressure (!) 112/59, pulse 67, temperature (!) 97.5 F (36.4 C), temperature source Tympanic, resp. rate 16, height $RemoveBe'5\' 6"'[id number]$  (1.676 m), weight 184 lb 6.4 oz (83.6 kg), SpO2 98 %.     Lymphatics: No cervical, supraclavicular, axillary, or inguinal nodes Resp: Lungs clear bilaterally Cardio: Regular rate and rhythm GI: No hepatosplenomegaly, no mass, nontender Vascular: No leg edema   Lab Results:  Lab Results  Component Value Date   WBC 4.0 11/29/2016   HGB 13.0 11/29/2016   HCT 37.8 11/29/2016   MCV 94.7 11/29/2016   PLT 155 11/29/2016   NEUTROABS 2.5 11/29/2016    CMP  Lab Results  Component Value Date   NA 141 05/11/2019   K 3.8 05/11/2019   CL 107 05/11/2019   CO2 25 05/11/2019   GLUCOSE 131 (H) 05/11/2019   BUN 12 05/11/2019   CREATININE 0.98 05/11/2019   CALCIUM 9.1 05/11/2019   PROT 7.4 05/11/2019   ALBUMIN 4.2 05/11/2019   AST 20 05/11/2019   ALT 17 05/11/2019   ALKPHOS 133 (H) 05/11/2019   BILITOT 0.8 05/11/2019   GFRNONAA >60 05/11/2019   GFRAA >60 05/11/2019    Lab Results  Component Value Date   CEA1 1.17 05/13/2020    Medications: I have reviewed the patient's current medications.   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, status post a laparoscopic hand assisted sigmoid colectomy 05/04/2016, stage IIIa (T2 N1)  1/14 lymph nodes positive for  metastatic carcinoma  No loss of mismatch repair protein expression  Cycle 1 adjuvant FOLFOX 06/13/2016  Cycle 2 adjuvant FOLFOX 07/03/2016 with Neulasta support  Cycle 3 adjuvant FOLFOX 07/16/2016 with Neulasta support (oxaliplatin dose reduced to 65 mg/m due to thrombocytopenia)  Cycle 4 adjuvant FOLFOX 08/01/2016 with Neulasta support  Cycle 5 adjuvant FOLFOX 08/15/2016 with Neulasta support (5-FU bolus held due to thrombocytopenia)  Cycle 6 adjuvant FOLFOX 08/29/2016, oxaliplatin further dose reduced  CTs 05/06/2017-no recurrent/metastatic disease. 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 Stable subpleural nodule left lower lobe.  CTs 05/26/2018- no evidence of recurrent colon cancer, chronic cavernous transformation of the portal vein with small esophageal varices  CTs 05/11/2019- scattered new 2-4 mm centrilobular nodules in both lungs, inflammatory etiology favored, chronic cavernous transformation of the portal vein  CT chest 08/25/2019-interval resolution of previous scattered centrilobular nodules compatible with resolved hypersensitivity pneumonitis or transient atypical infection.  2. H. pylori diagnosed on upper endoscopy 04/23/2016 3. Family history of colon cancer 4. 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 Family history of "blood clots"-06/13/2017  10/18/2017Positive lupus anticoagulant with negative anti-Cardiolipinand beta-2 glycoprotein antibody levels 06/14/2016, remainder of hypercoagulation panel negative  11/29/2016-positive lupus anticoagulant with negative beta-2 glycoprotein antibody levels, mildly elevated IgG anticardiolipin antibody  05/08/2018- negative anticardiolipin antibodies, negative lupus anticoagulant     Disposition: Martha Robinson is in remission from colon cancer.  We will follow up on the CEA from  today.  She will follow up with Dr. Shana Chute if she  has recurrent episodes of nausea/vomiting.  She last had a colonoscopy in 2018.  Martha Robinson will return for an office visit in 6 months.  Betsy Coder, MD  05/13/2020  1:03 PM

## 2020-05-16 ENCOUNTER — Telehealth: Payer: Self-pay | Admitting: Oncology

## 2020-05-16 NOTE — Telephone Encounter (Signed)
Scheduled appointments per 9/17 los. Called patient, no answer. Left a message on patient's voicemail with appointment date and times.

## 2020-05-18 ENCOUNTER — Telehealth: Payer: Self-pay | Admitting: *Deleted

## 2020-05-18 NOTE — Telephone Encounter (Signed)
-----   Message from Ladell Pier, MD sent at 05/17/2020 12:17 PM EDT -----  ----- Message ----- From: Ladell Pier, MD Sent: 05/16/2020  12:37 PM EDT To: Arlice Colt Pod [address]  Please call patient, CEA is normal, follow-up as scheduled

## 2020-05-18 NOTE — Telephone Encounter (Signed)
Notified of normal CEA and to f/u as scheduled.  

## 2020-11-10 ENCOUNTER — Inpatient Hospital Stay: Payer: Medicare Other | Attending: Nurse Practitioner

## 2020-11-10 ENCOUNTER — Inpatient Hospital Stay (HOSPITAL_BASED_OUTPATIENT_CLINIC_OR_DEPARTMENT_OTHER): Payer: Medicare Other | Admitting: Nurse Practitioner

## 2020-11-10 ENCOUNTER — Other Ambulatory Visit: Payer: Self-pay

## 2020-11-10 ENCOUNTER — Encounter: Payer: Self-pay | Admitting: Nurse Practitioner

## 2020-11-10 VITALS — BP 120/62 | HR 72 | Temp 97.6°F | Resp 18 | Ht 66.0 in | Wt 182.9 lb

## 2020-11-10 DIAGNOSIS — Z85038 Personal history of other malignant neoplasm of large intestine: Secondary | ICD-10-CM | POA: Diagnosis present

## 2020-11-10 DIAGNOSIS — Z9221 Personal history of antineoplastic chemotherapy: Secondary | ICD-10-CM | POA: Diagnosis not present

## 2020-11-10 DIAGNOSIS — C187 Malignant neoplasm of sigmoid colon: Secondary | ICD-10-CM

## 2020-11-10 LAB — CEA (IN HOUSE-CHCC): CEA (CHCC-In House): 1.14 ng/mL (ref 0.00–5.00)

## 2020-11-10 NOTE — Progress Notes (Addendum)
[place] OFFICE PROGRESS NOTE   Diagnosis:  Colon cancer  INTERVAL HISTORY:   Ms. Warr returns as scheduled.  She feels well.  No change in bowel habits.  No blood or pain with bowel movements.  No abdominal pain.  She has a good appetite.  Objective:  Vital signs in last 24 hours:  Blood pressure 120/62, pulse 72, temperature 97.6 F (36.4 C), temperature source Tympanic, resp. rate 18, height 5' 6" (1.676 m), weight 182 lb 14.4 oz (83 kg), SpO2 99 %.    HEENT: Neck without mass. Lymphatics: No palpable cervical, supraclavicular, axillary or inguinal lymph nodes. Resp: Lungs clear bilaterally. Cardio: Regular rate and rhythm. GI: Abdomen soft and nontender.  No hepatomegaly.  No mass. Vascular: No leg edema.   Lab Results:  Lab Results  Component Value Date   WBC 4.0 11/29/2016   HGB 13.0 11/29/2016   HCT 37.8 11/29/2016   MCV 94.7 11/29/2016   PLT 155 11/29/2016   NEUTROABS 2.5 11/29/2016    Imaging:  No results found.  Medications: I have reviewed the patient's current medications.  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, status post a laparoscopic hand assisted sigmoid colectomy 05/04/2016, stage IIIa (T2 N1)  1/14 lymph nodes positive for metastatic carcinoma  No loss of mismatch repair protein expression  Cycle 1 adjuvant FOLFOX 06/13/2016  Cycle 2 adjuvant FOLFOX 07/03/2016 with Neulasta support  Cycle 3 adjuvant FOLFOX 07/16/2016 with Neulasta support (oxaliplatin dose reduced to 65 mg/m due to thrombocytopenia)  Cycle 4 adjuvant FOLFOX 08/01/2016 with Neulasta support  Cycle 5 adjuvant FOLFOX 08/15/2016 with Neulasta support (5-FU bolus held due to thrombocytopenia)  Cycle 6 adjuvant FOLFOX 08/29/2016, oxaliplatin further dose reduced  CTs 05/06/2017-no recurrent/metastatic disease. 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 Stable  subpleural nodule left lower lobe.  CTs 05/26/2018-no evidence of recurrent colon cancer, chronic cavernous transformation of the portal vein with small esophageal varices  CTs 05/11/2019-scattered new 2-4 mm centrilobular nodules in both lungs, inflammatory etiology favored, chronic cavernous transformation of the portal vein  CT chest 08/25/2019-interval resolution of previous scattered centrilobular nodules compatible with resolved hypersensitivity pneumonitis or transient atypical infection.  2. H. pylori diagnosed on upper endoscopy 04/23/2016 3. Family history of colon cancer 4. 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 Family history of "blood clots"-06/13/2017  10/18/2017Positive lupus anticoagulant with negative anti-Cardiolipinand beta-2 glycoprotein antibody levels 06/14/2016, remainder of hypercoagulation panel negative  11/29/2016-positive lupus anticoagulant with negative beta-2 glycoprotein antibody levels, mildly elevated IgG anticardiolipin antibody  05/08/2018-negative anticardiolipin antibodies, negative lupus anticoagulant    Disposition: Ms. Cifelli remains in clinical remission from colon cancer.  We will follow-up on the CEA from today.  She is now 4-1/2 years out from initial diagnosis.  She does not wish to continue follow-up at the [facility].  We recommend she have a CEA checked annually for the next 2 years with her PCP.  She understands the importance of remaining up-to-date on surveillance colonoscopy.  We are available to see her in the future if needed.  Patient seen with Dr. Benay Spice.    Ned Card ANP/GNP-BC   11/10/2020  2:06 PM  This was a shared visit with Ned Card.  Ms. Royal remains in remission from colon cancer.  She would like to be discharged from the medical oncology clinic.  She will continue clinical follow-up with her primary provider.  She is due for a surveillance colonoscopy   with Dr.Shearin this year.  I was present for greater than 50% of today's visit.  I performed medical decision making.  Julieanne Manson, MD

## 2020-11-11 ENCOUNTER — Telehealth: Payer: Self-pay

## 2020-11-11 NOTE — Telephone Encounter (Signed)
-----   Message from Owens Shark, NP sent at 11/11/2020  8:33 AM EDT ----- Please let her know CEA is stable in normal range.  Plan as discussed in office visit.
